# Patient Record
Sex: Female | Born: 1975
Health system: Southern US, Community
[De-identification: ages and names within clinical notes are randomized; demographics above are authoritative.]

## PROBLEM LIST (undated history)

## (undated) DIAGNOSIS — F329 Major depressive disorder, single episode, unspecified: Secondary | ICD-10-CM

## (undated) DIAGNOSIS — E039 Hypothyroidism, unspecified: Secondary | ICD-10-CM

## (undated) DIAGNOSIS — F419 Anxiety disorder, unspecified: Secondary | ICD-10-CM

## (undated) DIAGNOSIS — F32A Depression, unspecified: Secondary | ICD-10-CM

## (undated) HISTORY — DX: Depression, unspecified: F32.A

## (undated) HISTORY — DX: Hypothyroidism, unspecified: E03.9

## (undated) HISTORY — DX: Major depressive disorder, single episode, unspecified: F32.9

## (undated) HISTORY — PX: HEART CHAMBER REVISION: SHX267

## (undated) HISTORY — DX: Anxiety disorder, unspecified: F41.9

---
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
## 1997-04-04 HISTORY — PX: WISDOM TOOTH EXTRACTION: SHX21

## 1998-08-12 ENCOUNTER — Emergency Department (HOSPITAL_COMMUNITY): Admission: EM | Admit: 1998-08-12 | Discharge: 1998-08-12 | Payer: Self-pay | Admitting: Emergency Medicine

## 1998-11-04 ENCOUNTER — Other Ambulatory Visit: Admission: RE | Admit: 1998-11-04 | Discharge: 1998-11-04 | Payer: Self-pay | Admitting: Obstetrics and Gynecology

## 1999-11-05 ENCOUNTER — Other Ambulatory Visit: Admission: RE | Admit: 1999-11-05 | Discharge: 1999-11-05 | Payer: Self-pay | Admitting: Obstetrics and Gynecology

## 2000-11-21 ENCOUNTER — Other Ambulatory Visit: Admission: RE | Admit: 2000-11-21 | Discharge: 2000-11-21 | Payer: Self-pay | Admitting: Obstetrics and Gynecology

## 2001-09-18 ENCOUNTER — Other Ambulatory Visit: Admission: RE | Admit: 2001-09-18 | Discharge: 2001-09-18 | Payer: Self-pay | Admitting: Obstetrics and Gynecology

## 2002-10-01 ENCOUNTER — Other Ambulatory Visit: Admission: RE | Admit: 2002-10-01 | Discharge: 2002-10-01 | Payer: Self-pay | Admitting: Obstetrics and Gynecology

## 2003-09-05 ENCOUNTER — Other Ambulatory Visit: Admission: RE | Admit: 2003-09-05 | Discharge: 2003-09-05 | Payer: Self-pay | Admitting: Obstetrics and Gynecology

## 2004-09-02 ENCOUNTER — Other Ambulatory Visit: Admission: RE | Admit: 2004-09-02 | Discharge: 2004-09-02 | Payer: Self-pay | Admitting: Obstetrics and Gynecology

## 2005-03-07 ENCOUNTER — Inpatient Hospital Stay (HOSPITAL_COMMUNITY): Admission: AD | Admit: 2005-03-07 | Discharge: 2005-03-10 | Payer: Self-pay | Admitting: Obstetrics and Gynecology

## 2005-04-28 ENCOUNTER — Other Ambulatory Visit: Admission: RE | Admit: 2005-04-28 | Discharge: 2005-04-28 | Payer: Self-pay | Admitting: Obstetrics and Gynecology

## 2011-11-23 ENCOUNTER — Other Ambulatory Visit: Payer: Self-pay | Admitting: Obstetrics and Gynecology

## 2013-02-04 ENCOUNTER — Other Ambulatory Visit: Payer: Self-pay | Admitting: Internal Medicine

## 2013-02-04 LAB — LIPID PANEL
Cholesterol: 205 mg/dL — ABNORMAL HIGH (ref 0–200)
VLDL: 28 mg/dL (ref 0–40)

## 2013-02-04 LAB — CBC WITH DIFFERENTIAL/PLATELET
Basophils Absolute: 0 10*3/uL (ref 0.0–0.1)
Basophils Relative: 1 % (ref 0–1)
Eosinophils Absolute: 0.3 10*3/uL (ref 0.0–0.7)
MCH: 29.5 pg (ref 26.0–34.0)
MCHC: 34.2 g/dL (ref 30.0–36.0)
Monocytes Relative: 6 % (ref 3–12)
Neutrophils Relative %: 54 % (ref 43–77)
Platelets: 271 10*3/uL (ref 150–400)
RDW: 13.7 % (ref 11.5–15.5)

## 2013-02-04 LAB — BASIC METABOLIC PANEL WITH GFR
Calcium: 10 mg/dL (ref 8.4–10.5)
GFR, Est African American: >89 mL/min
GFR, Est Non African American: >89 mL/min
Potassium: 4.4 mEq/L (ref 3.5–5.3)
Sodium: 139 mEq/L (ref 135–145)

## 2013-02-04 LAB — HEPATIC FUNCTION PANEL
ALT: 17 U/L (ref 0–35)
Bilirubin, Direct: 0.1 mg/dL (ref 0.0–0.3)
Indirect Bilirubin: 0.3 mg/dL (ref 0.0–0.9)

## 2013-02-04 LAB — IRON AND TIBC
%SAT: 16 % — ABNORMAL LOW (ref 20–55)
Iron: 60 ug/dL (ref 42–145)
UIBC: 309 ug/dL (ref 125–400)

## 2013-02-05 LAB — TSH: TSH: 4.795 u[IU]/mL — ABNORMAL HIGH (ref 0.350–4.500)

## 2013-02-05 LAB — VITAMIN B12: Vitamin B-12: 430 pg/mL (ref 211–911)

## 2013-02-05 LAB — URINALYSIS, ROUTINE W REFLEX MICROSCOPIC
Bilirubin Urine: NEGATIVE
Glucose, UA: NEGATIVE mg/dL
Hgb urine dipstick: NEGATIVE
Ketones, ur: NEGATIVE mg/dL
Specific Gravity, Urine: <1.005 — ABNORMAL LOW (ref 1.005–1.030)
Urobilinogen, UA: 0.2 mg/dL (ref 0.0–1.0)

## 2013-02-05 LAB — MICROALBUMIN / CREATININE URINE RATIO
Creatinine, Urine: 16.9 mg/dL
Microalb, Ur: 0.5 mg/dL (ref 0.00–1.89)

## 2013-02-05 LAB — VITAMIN D 25 HYDROXY (VIT D DEFICIENCY, FRACTURES): Vit D, 25-Hydroxy: 33 ng/mL (ref 30–89)

## 2013-02-05 LAB — HEMOGLOBIN A1C
Hgb A1c MFr Bld: 5.7 % — ABNORMAL HIGH (ref <5.7)
Mean Plasma Glucose: 117 mg/dL — ABNORMAL HIGH (ref <117)

## 2013-02-21 ENCOUNTER — Other Ambulatory Visit: Payer: Self-pay | Admitting: Internal Medicine

## 2013-02-21 MED ORDER — LEVOTHYROXINE SODIUM 75 MCG PO TABS: 75.0000 ug | ORAL_TABLET | Freq: Every day | ORAL | Status: DC

## 2013-03-07 ENCOUNTER — Other Ambulatory Visit: Payer: Self-pay

## 2013-04-12 ENCOUNTER — Other Ambulatory Visit: Payer: Self-pay | Admitting: Obstetrics and Gynecology

## 2013-04-12 DIAGNOSIS — N644 Mastodynia: Secondary | ICD-10-CM

## 2013-04-24 ENCOUNTER — Other Ambulatory Visit: Payer: Self-pay | Admitting: Obstetrics and Gynecology

## 2013-04-24 ENCOUNTER — Ambulatory Visit
Admission: RE | Admit: 2013-04-24 | Discharge: 2013-04-24 | Disposition: A | Discharge status: Home / Self Care | Payer: 59 | Source: Ambulatory Visit | Attending: Obstetrics and Gynecology | Admitting: Obstetrics and Gynecology

## 2013-04-24 DIAGNOSIS — N644 Mastodynia: Secondary | ICD-10-CM

## 2013-05-08 ENCOUNTER — Other Ambulatory Visit: Payer: Self-pay | Admitting: Physician Assistant

## 2013-06-17 ENCOUNTER — Ambulatory Visit (INDEPENDENT_AMBULATORY_CARE_PROVIDER_SITE_OTHER): Payer: 59 | Admitting: Physician Assistant

## 2013-06-17 VITALS — BP 110/68 | HR 68 | Temp 98.1°F | Resp 16 | Wt 156.0 lb

## 2013-06-17 DIAGNOSIS — M5430 Sciatica, unspecified side: Secondary | ICD-10-CM

## 2013-06-17 DIAGNOSIS — M5432 Sciatica, left side: Secondary | ICD-10-CM

## 2013-06-17 DIAGNOSIS — R3129 Other microscopic hematuria: Secondary | ICD-10-CM

## 2013-06-17 DIAGNOSIS — E039 Hypothyroidism, unspecified: Secondary | ICD-10-CM

## 2013-06-17 MED ORDER — MELOXICAM 15 MG PO TABS: 15.0000 mg | ORAL_TABLET | Freq: Every day | ORAL | Status: DC

## 2013-06-17 MED ORDER — CYCLOBENZAPRINE HCL 10 MG PO TABS: 10.0000 mg | ORAL_TABLET | Freq: Three times a day (TID) | ORAL | Status: DC | PRN

## 2013-06-17 MED ORDER — DEXAMETHASONE SODIUM PHOSPHATE 100 MG/10ML IJ SOLN
10.0000 mg | Freq: Once | INTRAMUSCULAR | Status: AC
  Administered 2013-06-17: 10 mg via INTRAMUSCULAR

## 2013-06-17 MED ORDER — LIDOCAINE HCL 1 % IJ SOLN
10.0000 mL | Freq: Once | INTRAMUSCULAR | Status: AC
  Administered 2013-06-17: 10 mL

## 2013-06-17 NOTE — Progress Notes (Signed)
   Subjective:    Patient ID: Alyssa Skinner, female    DOB: 04/02/1976, [age] y.o.   MRN: [id number]  HPI 38 y.o. female states that she was turning to get medication when she felt a pain in left butt down lateral leg to knee. She went to urgent care and they gave her toradol, got a urine that showed microscopic blood. Has improved, currently still on Ketorolac 10mg  daily, worse with turning, bending but no longer radiating to her leg. Better with rest/ being still.   Last Menses was on 06/08/2013. Denies trouble with urination, bowel problems, weakness, nausea, vomiting.    Review of Systems  Constitutional: Negative.   HENT: Negative.   Respiratory: Negative.   Cardiovascular: Negative.   Gastrointestinal: Negative.   Genitourinary: Negative.   Musculoskeletal: Positive for back pain and gait problem (antalgic). Negative for myalgias.  Neurological: Negative.        Objective:   Physical Exam  Constitutional: She appears well-developed and well-nourished.  HENT:  Head: Normocephalic and atraumatic.  Cardiovascular: Normal rate and regular rhythm.   Pulmonary/Chest: Effort normal and breath sounds normal.  Abdominal: Soft. Bowel sounds are normal.  Musculoskeletal: She exhibits tenderness.  + left SI pain, neg straight leg, reflexes equal bilaterally, strength 5/5, some left SI pain with hip flexion/rotation but full ROM. Negative CVA  Skin: Skin is warm and dry.       Assessment & Plan:  Left SI pain- likely muscular, trigger point given, tolerated well RICE, exercises, flexeril, mobic A trigger point injection was performed at the site of maximal tenderness using 1% plain Lidocaine and Dexamethasone 10mg . This was well tolerated, and followed by immediate relief of pain.   Hypothyroid- check TSH  Hematuria- recheck urine, kidney stone unlikely.

## 2013-06-17 NOTE — Patient Instructions (Signed)

## 2013-06-18 LAB — URINALYSIS, ROUTINE W REFLEX MICROSCOPIC
Bilirubin Urine: NEGATIVE
Glucose, UA: NEGATIVE mg/dL
HGB URINE DIPSTICK: NEGATIVE
Ketones, ur: NEGATIVE mg/dL
LEUKOCYTES UA: NEGATIVE
NITRITE: NEGATIVE
Protein, ur: NEGATIVE mg/dL
Specific Gravity, Urine: 1.02 (ref 1.005–1.030)
Urobilinogen, UA: 0.2 mg/dL (ref 0.0–1.0)
pH: 6.5 (ref 5.0–8.0)

## 2013-06-18 LAB — TSH: TSH: 3.355 u[IU]/mL (ref 0.350–4.500)

## 2013-06-18 LAB — URINE CULTURE
Colony Count: NO GROWTH
Organism ID, Bacteria: NO GROWTH

## 2013-06-25 ENCOUNTER — Other Ambulatory Visit: Payer: Self-pay | Admitting: Obstetrics and Gynecology

## 2013-07-08 ENCOUNTER — Other Ambulatory Visit: Payer: Self-pay | Admitting: Emergency Medicine

## 2013-08-26 ENCOUNTER — Other Ambulatory Visit: Payer: Self-pay | Admitting: Physician Assistant

## 2013-09-04 ENCOUNTER — Other Ambulatory Visit: Payer: Self-pay | Admitting: Physician Assistant

## 2013-09-04 MED ORDER — ALPRAZOLAM 0.5 MG PO TABS: ORAL_TABLET | ORAL | Status: DC

## 2013-09-04 MED ORDER — LEVOTHYROXINE SODIUM 112 MCG PO TABS: 112.0000 ug | ORAL_TABLET | Freq: Every day | ORAL | Status: DC

## 2013-10-21 LAB — OB RESULTS CONSOLE HIV ANTIBODY (ROUTINE TESTING): HIV: NONREACTIVE

## 2013-10-21 LAB — OB RESULTS CONSOLE HEPATITIS B SURFACE ANTIGEN: Hepatitis B Surface Ag: NEGATIVE

## 2013-10-21 LAB — OB RESULTS CONSOLE RPR: RPR: NONREACTIVE

## 2013-10-21 LAB — OB RESULTS CONSOLE RUBELLA ANTIBODY, IGM: RUBELLA: NON-IMMUNE/NOT IMMUNE

## 2013-10-28 ENCOUNTER — Other Ambulatory Visit: Payer: Self-pay | Admitting: Physician Assistant

## 2013-11-05 LAB — OB RESULTS CONSOLE GC/CHLAMYDIA
Chlamydia: NEGATIVE
GC PROBE AMP, GENITAL: NEGATIVE

## 2014-01-09 ENCOUNTER — Other Ambulatory Visit: Payer: Self-pay | Admitting: Physician Assistant

## 2014-01-09 MED ORDER — ALPRAZOLAM 0.5 MG PO TABS: ORAL_TABLET | ORAL | Status: DC

## 2014-02-02 DIAGNOSIS — E039 Hypothyroidism, unspecified: Secondary | ICD-10-CM | POA: Insufficient documentation

## 2014-02-02 DIAGNOSIS — F329 Major depressive disorder, single episode, unspecified: Secondary | ICD-10-CM | POA: Insufficient documentation

## 2014-02-02 DIAGNOSIS — F419 Anxiety disorder, unspecified: Secondary | ICD-10-CM | POA: Insufficient documentation

## 2014-02-02 DIAGNOSIS — F3341 Major depressive disorder, recurrent, in partial remission: Secondary | ICD-10-CM | POA: Insufficient documentation

## 2014-02-04 ENCOUNTER — Encounter: Payer: Self-pay | Admitting: Physician Assistant

## 2014-05-01 LAB — OB RESULTS CONSOLE GBS: GBS: NEGATIVE

## 2014-05-12 ENCOUNTER — Encounter (HOSPITAL_COMMUNITY): Payer: Self-pay | Admitting: *Deleted

## 2014-05-12 ENCOUNTER — Inpatient Hospital Stay (HOSPITAL_COMMUNITY): Payer: 59 | Admitting: Anesthesiology

## 2014-05-12 ENCOUNTER — Inpatient Hospital Stay (HOSPITAL_COMMUNITY)
Admission: AD | Admit: 2014-05-12 | Discharge: 2014-05-14 | DRG: 775 | Disposition: A | Discharge status: Home / Self Care | Payer: 59 | Source: Ambulatory Visit | Attending: Obstetrics and Gynecology | Admitting: Obstetrics and Gynecology

## 2014-05-12 DIAGNOSIS — O09523 Supervision of elderly multigravida, third trimester: Secondary | ICD-10-CM

## 2014-05-12 DIAGNOSIS — F419 Anxiety disorder, unspecified: Secondary | ICD-10-CM | POA: Diagnosis present

## 2014-05-12 DIAGNOSIS — Z3A38 38 weeks gestation of pregnancy: Secondary | ICD-10-CM | POA: Diagnosis present

## 2014-05-12 DIAGNOSIS — O99284 Endocrine, nutritional and metabolic diseases complicating childbirth: Principal | ICD-10-CM | POA: Diagnosis present

## 2014-05-12 DIAGNOSIS — O99344 Other mental disorders complicating childbirth: Secondary | ICD-10-CM | POA: Diagnosis present

## 2014-05-12 DIAGNOSIS — E039 Hypothyroidism, unspecified: Secondary | ICD-10-CM | POA: Diagnosis present

## 2014-05-12 DIAGNOSIS — F329 Major depressive disorder, single episode, unspecified: Secondary | ICD-10-CM | POA: Diagnosis present

## 2014-05-12 LAB — TYPE AND SCREEN
ABO/RH(D): O POS
ANTIBODY SCREEN: NEGATIVE

## 2014-05-12 LAB — CBC
HCT: 32.5 % — ABNORMAL LOW (ref 36.0–46.0)
HEMOGLOBIN: 11.4 g/dL — AB (ref 12.0–15.0)
MCH: 30.2 pg (ref 26.0–34.0)
MCHC: 35.1 g/dL (ref 30.0–36.0)
MCV: 86 fL (ref 78.0–100.0)
PLATELETS: 252 10*3/uL (ref 150–400)
RBC: 3.78 MIL/uL — ABNORMAL LOW (ref 3.87–5.11)
RDW: 13.3 % (ref 11.5–15.5)
WBC: 12 10*3/uL — ABNORMAL HIGH (ref 4.0–10.5)

## 2014-05-12 LAB — ABO/RH: ABO/RH(D): O POS

## 2014-05-12 MED ORDER — DIPHENHYDRAMINE HCL 25 MG PO CAPS: 25.0000 mg | ORAL_CAPSULE | Freq: Four times a day (QID) | ORAL | Status: DC | PRN

## 2014-05-12 MED ORDER — LIDOCAINE HCL (PF) 1 % IJ SOLN
30.0000 mL | INTRAMUSCULAR | Status: DC | PRN
  Filled 2014-05-12: qty 30

## 2014-05-12 MED ORDER — PHENYLEPHRINE 40 MCG/ML (10ML) SYRINGE FOR IV PUSH (FOR BLOOD PRESSURE SUPPORT)
80.0000 ug | PREFILLED_SYRINGE | INTRAVENOUS | Status: DC | PRN
  Filled 2014-05-12: qty 2
  Filled 2014-05-12: qty 20

## 2014-05-12 MED ORDER — LIDOCAINE-EPINEPHRINE (PF) 2 %-1:200000 IJ SOLN
INTRAMUSCULAR | Status: DC | PRN
  Administered 2014-05-12: 3 mL

## 2014-05-12 MED ORDER — BENZOCAINE-MENTHOL 20-0.5 % EX AERO
1.0000 "application " | INHALATION_SPRAY | CUTANEOUS | Status: DC | PRN
  Administered 2014-05-12: 1 via TOPICAL
  Filled 2014-05-12: qty 56

## 2014-05-12 MED ORDER — BUPIVACAINE HCL (PF) 0.25 % IJ SOLN
INTRAMUSCULAR | Status: DC | PRN
Start: 2014-05-12 — End: 2014-05-12
  Administered 2014-05-12 (×2): 4 mL via EPIDURAL

## 2014-05-12 MED ORDER — ONDANSETRON HCL 4 MG/2ML IJ SOLN: 4.0000 mg | Freq: Four times a day (QID) | INTRAMUSCULAR | Status: DC | PRN

## 2014-05-12 MED ORDER — ACETAMINOPHEN 325 MG PO TABS: 650.0000 mg | ORAL_TABLET | ORAL | Status: DC | PRN

## 2014-05-12 MED ORDER — CITRIC ACID-SODIUM CITRATE 334-500 MG/5ML PO SOLN: 30.0000 mL | ORAL | Status: DC | PRN

## 2014-05-12 MED ORDER — FENTANYL 2.5 MCG/ML BUPIVACAINE 1/10 % EPIDURAL INFUSION (WH - ANES)
14.0000 mL/h | INTRAMUSCULAR | Status: DC | PRN
  Administered 2014-05-12 (×2): 14 mL/h via EPIDURAL
  Filled 2014-05-12 (×2): qty 125

## 2014-05-12 MED ORDER — WITCH HAZEL-GLYCERIN EX PADS: 1.0000 "application " | MEDICATED_PAD | CUTANEOUS | Status: DC | PRN

## 2014-05-12 MED ORDER — MEASLES, MUMPS & RUBELLA VAC ~~LOC~~ INJ
0.5000 mL | INJECTION | Freq: Once | SUBCUTANEOUS | Status: AC
  Administered 2014-05-14: 0.5 mL via SUBCUTANEOUS
  Filled 2014-05-12: qty 0.5

## 2014-05-12 MED ORDER — OXYTOCIN BOLUS FROM INFUSION
500.0000 mL | INTRAVENOUS | Status: DC
  Administered 2014-05-12: 500 mL via INTRAVENOUS

## 2014-05-12 MED ORDER — OXYCODONE-ACETAMINOPHEN 5-325 MG PO TABS: 2.0000 | ORAL_TABLET | ORAL | Status: DC | PRN

## 2014-05-12 MED ORDER — OXYCODONE-ACETAMINOPHEN 5-325 MG PO TABS: 1.0000 | ORAL_TABLET | ORAL | Status: DC | PRN

## 2014-05-12 MED ORDER — LACTATED RINGERS IV SOLN
INTRAVENOUS | Status: DC
  Administered 2014-05-12: 06:00:00 via INTRAVENOUS

## 2014-05-12 MED ORDER — ONDANSETRON HCL 4 MG/2ML IJ SOLN: 4.0000 mg | INTRAMUSCULAR | Status: DC | PRN

## 2014-05-12 MED ORDER — ONDANSETRON HCL 4 MG PO TABS: 4.0000 mg | ORAL_TABLET | ORAL | Status: DC | PRN

## 2014-05-12 MED ORDER — TETANUS-DIPHTH-ACELL PERTUSSIS 5-2.5-18.5 LF-MCG/0.5 IM SUSP
0.5000 mL | Freq: Once | INTRAMUSCULAR | Status: DC
Start: 2014-05-13 — End: 2014-05-13

## 2014-05-12 MED ORDER — IBUPROFEN 600 MG PO TABS
600.0000 mg | ORAL_TABLET | Freq: Four times a day (QID) | ORAL | Status: DC
  Administered 2014-05-12 – 2014-05-14 (×7): 600 mg via ORAL
  Filled 2014-05-12 (×7): qty 1

## 2014-05-12 MED ORDER — DIPHENHYDRAMINE HCL 50 MG/ML IJ SOLN: 12.5000 mg | INTRAMUSCULAR | Status: DC | PRN

## 2014-05-12 MED ORDER — LANOLIN HYDROUS EX OINT: TOPICAL_OINTMENT | CUTANEOUS | Status: DC | PRN

## 2014-05-12 MED ORDER — LACTATED RINGERS IV SOLN: 500.0000 mL | Freq: Once | INTRAVENOUS | Status: DC

## 2014-05-12 MED ORDER — MEDROXYPROGESTERONE ACETATE 150 MG/ML IM SUSP: 150.0000 mg | INTRAMUSCULAR | Status: DC | PRN

## 2014-05-12 MED ORDER — LEVOTHYROXINE SODIUM 112 MCG PO TABS
112.0000 ug | ORAL_TABLET | Freq: Every day | ORAL | Status: DC
  Filled 2014-05-12 (×3): qty 1

## 2014-05-12 MED ORDER — EPHEDRINE 5 MG/ML INJ
10.0000 mg | INTRAVENOUS | Status: DC | PRN
  Filled 2014-05-12: qty 2

## 2014-05-12 MED ORDER — LACTATED RINGERS IV SOLN: 500.0000 mL | INTRAVENOUS | Status: DC | PRN

## 2014-05-12 MED ORDER — SIMETHICONE 80 MG PO CHEW
80.0000 mg | CHEWABLE_TABLET | ORAL | Status: DC | PRN
Start: 2014-05-12 — End: 2014-05-14

## 2014-05-12 MED ORDER — PRENATAL MULTIVITAMIN CH
1.0000 | ORAL_TABLET | Freq: Every day | ORAL | Status: DC
  Administered 2014-05-13 – 2014-05-14 (×2): 1 via ORAL
  Filled 2014-05-12: qty 1

## 2014-05-12 MED ORDER — DIBUCAINE 1 % RE OINT: 1.0000 "application " | TOPICAL_OINTMENT | RECTAL | Status: DC | PRN

## 2014-05-12 MED ORDER — FENTANYL 2.5 MCG/ML BUPIVACAINE 1/10 % EPIDURAL INFUSION (WH - ANES)
INTRAMUSCULAR | Status: DC | PRN
  Administered 2014-05-12: 14 mL/h via EPIDURAL

## 2014-05-12 MED ORDER — OXYTOCIN 40 UNITS IN LACTATED RINGERS INFUSION - SIMPLE MED
62.5000 mL/h | INTRAVENOUS | Status: DC
  Filled 2014-05-12: qty 1000

## 2014-05-12 MED ORDER — FLEET ENEMA 7-19 GM/118ML RE ENEM: 1.0000 | ENEMA | RECTAL | Status: DC | PRN

## 2014-05-12 MED ORDER — SENNOSIDES-DOCUSATE SODIUM 8.6-50 MG PO TABS
2.0000 | ORAL_TABLET | ORAL | Status: DC
  Administered 2014-05-13 – 2014-05-14 (×2): 2 via ORAL
  Filled 2014-05-12 (×2): qty 2

## 2014-05-12 MED ORDER — PHENYLEPHRINE 40 MCG/ML (10ML) SYRINGE FOR IV PUSH (FOR BLOOD PRESSURE SUPPORT)
80.0000 ug | PREFILLED_SYRINGE | INTRAVENOUS | Status: DC | PRN
  Filled 2014-05-12: qty 2

## 2014-05-12 NOTE — Anesthesia Procedure Notes (Signed)
Epidural Patient location during procedure: OB  Staffing Anesthesiologist: Otto Felkins, CHRIS Performed by: anesthesiologist   Preanesthetic Checklist Completed: patient identified, surgical consent, pre-op evaluation, timeout performed, IV checked, risks and benefits discussed and monitors and equipment checked  Epidural Patient position: sitting Prep: site prepped and draped and DuraPrep Patient monitoring: heart rate, cardiac monitor, continuous pulse ox and blood pressure Approach: midline Location: L3-L4 Injection technique: LOR saline  Needle:  Needle type: Tuohy  Needle gauge: 17 G Needle length: 9 cm Needle insertion depth: 7 cm Catheter type: closed end flexible Catheter size: 19 Gauge Catheter at skin depth: 13 cm Test dose: negative and 2% lidocaine with Epi 1:200 K  Assessment Events: blood not aspirated, injection not painful, no injection resistance, negative IV test and no paresthesia  Additional Notes H+P and labs checked, risks and benefits discussed with the patient, consent obtained, procedure tolerated well and without complications.  Reason for block:procedure for pain   

## 2014-05-12 NOTE — Anesthesia Preprocedure Evaluation (Signed)
Anesthesia Evaluation  Patient identified by MRN, date of birth, ID band Patient awake    Reviewed: Allergy & Precautions, NPO status , Patient's Chart, lab work & pertinent test results  History of Anesthesia Complications Negative for: history of anesthetic complications  Airway Mallampati: II  TM Distance: >3 FB Neck ROM: Full    Dental  (+) Teeth Intact   Pulmonary neg shortness of breath, neg sleep apnea, neg COPDneg recent URI, former smoker,  breath sounds clear to auscultation        Cardiovascular negative cardio ROS  Rhythm:Regular  Closure of PDA surgically   Neuro/Psych PSYCHIATRIC DISORDERS Anxiety Depression negative neurological ROS     GI/Hepatic negative GI ROS,   Endo/Other  Hypothyroidism   Renal/GU      Musculoskeletal   Abdominal   Peds  Hematology  (+) anemia ,   Anesthesia Other Findings   Reproductive/Obstetrics                             Anesthesia Physical Anesthesia Plan  ASA: II  Anesthesia Plan: Epidural   Post-op Pain Management:    Induction:   Airway Management Planned:   Additional Equipment:   Intra-op Plan:   Post-operative Plan:   Informed Consent: I have reviewed the patients History and Physical, chart, labs and discussed the procedure including the risks, benefits and alternatives for the proposed anesthesia with the patient or authorized representative who has indicated his/her understanding and acceptance.     Plan Discussed with: Anesthesiologist  Anesthesia Plan Comments:         Anesthesia Quick Evaluation

## 2014-05-12 NOTE — Lactation Note (Signed)
This note was copied from the chart of Girl Olevia Bowensngela Brideau. Lactation Consultation Note  Patient Name: Girl Olevia Bowensngela Peoples ZOXWR'UToday's Date: 05/12/2014 Reason for consult: Follow-up assessment;Infant < 6lbs;Difficult latch Baby 8 hours of life. Mom states her nurse showed her how to hand express and she was able to obtain colostrum and give drops to baby. Mom reports that baby about to get her bath. Enc mom to call out for assistance with latching when baby cues to nurse. Discussed beginning use of DEBP if baby continues not to latch well through the night since mom did not have a good supply with first baby and this baby just over 5 pounds.   Maternal Data    Feeding Feeding Type: Breast Fed Length of feed: 0 min  LATCH Score/Interventions                      Lactation Tools Discussed/Used     Consult Status Consult Status: PRN    Geralynn OchsWILLIARD, Fredis Malkiewicz 05/12/2014, 8:38 PM

## 2014-05-12 NOTE — Lactation Note (Signed)
This note was copied from the chart of Alyssa Olevia Bowensngela Rohwer. Lactation Consultation Note  Patient Name: Alyssa Skinner RUEAV'WToday's Date: 05/12/2014 Reason for consult: Initial assessment;Infant < 6lbs  Initial visit at 4 hrs after birth.  Baby 38 wks, and 5 lbs 3.6 oz birth weight.  Baby latched on after birth for 10 minutes, with a 6 latch score (flat nipples and occasional swallowing) .  Serum blood sugar done recently 57.  Mom has attempted another time to latch, but baby not interested.  Baby skin to skin at present.  Offered assistance with positioning and latching, but Mom exhausted.  Offered to show her how to manually express colostrum, but she declined, so LC talked her through it.  Explained how she can manually express prior to putting baby on the breast.  I also discussed that she could manually express colostrum into a cup and dropper feed to baby. Talked about setting up a pump to increase stimulation for her milk supply.  Mom started to tear up, so I told her she should enjoy her baby skin to skin on her chest.  Mom is exhausted and has a history of anxiety and depression.  I asked her to [facility] or her RN when baby starts to cue to feed. Mom did express that she had no milk with first baby, who is 9 now.  She stated he never latched, and doesn't describe any engorgement.  She was undiagnosed hypothyroid, but is now being treated.  Mom describes tenderness and some breast enlargement at pregnancy onset.  Reassured her that every pregnancy, and every baby is different.  I explained that we are there for her to make this a positive experience for her.  Brochure left with Mom.  Informed Mom of IP and OP lactation services available to her.  To call for help, and follow up in am.   Consult Status Consult Status: Follow-up Date: 05/12/14 Follow-up type: In-patient    Judee ClaraSmith, Dion Sibal E 05/12/2014, 4:20 PM

## 2014-05-12 NOTE — MAU Note (Signed)
Report given to VanossAleta, Charity fundraiserN. Pt may transfer to room 164.

## 2014-05-12 NOTE — H&P (Signed)
Alyssa Skinner is a 39 y.o. female presenting for labor.  Pt denies pregnancy complications.  No vb or lof.    History OB History    Gravida Para Term Preterm AB TAB SAB Ectopic Multiple Living   2 1        1      Past Medical History  Diagnosis Date  . Hypothyroid   . Depression   . Anxiety    Past Surgical History  Procedure Laterality Date  . Heart chamber revision  1979   Family History: family history includes Hyperlipidemia in her father and mother. Social History:  reports that she has quit smoking. She has never used smokeless tobacco. She reports that she drinks alcohol. She reports that she does not use illicit drugs.   Prenatal Transfer Tool  Maternal Diabetes: No Genetic Screening Maternal Ultrasounds/Referrals: Normal Fetal Ultrasounds or other Referrals:  None Maternal Substance Abuse:  No Significant Maternal Medications:  None Significant Maternal Lab Results:  None Other Comments:  None  ROS  Dilation: 5.5 Effacement (%): 100 Station: -1 Exam by:: ansah-mensah, rnc Blood pressure 113/71, pulse 78, temperature 98.5 F (36.9 C), temperature source Oral, resp. rate 20, height 5\' 8"  (1.727 m), weight 76.204 kg (168 lb), SpO2 100 %. Exam Physical Exam  Gen - comfortable w/ epidural Abd - gravid, NT Ext - NT Cvx 6/90/-1 AROM - clear Prenatal labs: ABO, Rh: --/--/O POS, O POS (02/08 0240) Antibody: NEG (02/08 0240) Rubella: Nonimmune (07/20 0000) RPR: Nonreactive (07/20 0000)  HBsAg: Negative (07/20 0000)  HIV: Non-reactive (07/20 0000)  GBS: Negative (01/28 0000)   Assessment/Plan: Labor Exp mngt   Lanis Storlie 05/12/2014, 8:04 AM

## 2014-05-12 NOTE — Progress Notes (Signed)
SVD of vigorous female infant w/ apgars of 9,9.  Placenta delivered spontaneous w/ 3VC.   2nd degree midline episiotomy repaired w/ 3-0 vicryl rapide.  Fundus firm.  EBL 350cc .

## 2014-05-13 LAB — CBC
HCT: 30.2 % — ABNORMAL LOW (ref 36.0–46.0)
HEMOGLOBIN: 10.2 g/dL — AB (ref 12.0–15.0)
MCH: 29.9 pg (ref 26.0–34.0)
MCHC: 33.8 g/dL (ref 30.0–36.0)
MCV: 88.6 fL (ref 78.0–100.0)
Platelets: 199 10*3/uL (ref 150–400)
RBC: 3.41 MIL/uL — ABNORMAL LOW (ref 3.87–5.11)
RDW: 13.4 % (ref 11.5–15.5)
WBC: 12.3 10*3/uL — ABNORMAL HIGH (ref 4.0–10.5)

## 2014-05-13 LAB — RPR: RPR Ser Ql: NONREACTIVE

## 2014-05-13 NOTE — Plan of Care (Signed)
Problem: Discharge Progression Outcomes Goal: MMR given as ordered Outcome: Not Progressing Needs MMR     

## 2014-05-13 NOTE — Lactation Note (Signed)
This note was copied from the chart of Alyssa Skinner. Lactation Consultation Note Baby hadn't BF during the night d/t didn't wake up and mom slept also. RN discussed baby small must wake to BF every 3 hrs. At least. When entered rm. Mom BF in cradle position, baby just sucking on tip of nipple edges. Explained milk transfer and need for baby to get nipple back into the mouth. Mom has shells but wasn't wearing them. Encouraged to put bra on after BF and wear to assist in everting nipples. Fitted #16 NS, demonstrated application and care. Baby latched well in football position, gave 7ml Alementum via curve tip syring in NS. Demonstrated breast massage and latching w/"C" hold. Mom has large tubular breast and large areaols, small flat nipples. Nipple borders not defined well. Has darker pigmentation around nipple, looks almost like bruising, but mom states that's her normal nipple. DEBP set up and instructions on cleaning. Mom is to post-pump 15-20 min to stimulate breast. Hand expression w/glistening of colostrum to end of nipple. Noted good colostrum in nipple shield after BF.  plan is to put baby to breast at least every 3 hrs. If haven't cued before then. Use NS #16, post-pump, if obtains colostrum may give that and subtract from formula for amount on feeding sheet according to hours of age. Patient Name: Alyssa Skinner ZOXWR'UToday's Date: 05/13/2014 Reason for consult: Follow-up assessment;Difficult latch   Maternal Data    Feeding Feeding Type: Formula Length of feed: 15 min  LATCH Score/Interventions Latch: Grasps breast easily, tongue down, lips flanged, rhythmical sucking. Intervention(s): Adjust position;Assist with latch;Breast massage;Breast compression  Audible Swallowing: A few with stimulation Intervention(s): Skin to skin;Hand expression;Alternate breast massage  Type of Nipple: Flat Intervention(s): Double electric pump;Shells;Hand pump  Comfort (Breast/Nipple): Soft /  non-tender     Hold (Positioning): Assistance needed to correctly position infant at breast and maintain latch. Intervention(s): Breastfeeding basics reviewed;Support Pillows;Position options;Skin to skin  LATCH Score: 7  Lactation Tools Discussed/Used Tools: Shells;Nipple Dorris CarnesShields;Pump Nipple shield size: 16 Shell Type: Inverted Breast pump type: Double-Electric Breast Pump Pump Review: Setup, frequency, and cleaning;Milk Storage Initiated by:: Peri JeffersonL. Jeren Dufrane RN Date initiated:: 05/13/14   Consult Status Consult Status: Follow-up Date: 05/13/14 Follow-up type: In-patient    Leafy Motsinger, Diamond NickelLAURA G 05/13/2014, 9:08 AM

## 2014-05-13 NOTE — Progress Notes (Signed)
Clinical Social Work Department PSYCHOSOCIAL ASSESSMENT - MATERNAL/CHILD 05/13/2014  Patient:  Alyssa Skinner, Alyssa Skinner  Account Number:  [id number]  Admit Date:  05/12/2014  Ardine Eng Name:   Monia Pouch   Clinical Social Worker:  Lucita Ferrara, CLINICAL SOCIAL WORKER   Date/Time:  05/13/2014 09:15 AM  Date Referred:  05/12/2014   Referral source  Central Nursery     Referred reason  Depression/Anxiety   Other referral source:    I:  FAMILY / HOME ENVIRONMENT Child's legal guardian:  PARENT  Guardian - Name Guardian - Age Guardian - Address  K[place] [address] [address], [place] [address]  [address]  same as above   Other household support members/support persons Name Relationship DOB  Patrecia Pace 37 years old   Other support:   MOB endorsed strong family support.    II  PSYCHOSOCIAL DATA Information Source:  Patient Interview  Insurance risk surveyor Resources Employment:   MOB endorsed positive employer support.   Financial resources:  Multimedia programmer If [place]:    School / Grade:  N/A Music therapist / Child Services Coordination / Early Interventions:   None reported  Cultural issues impacting care:   None reported    III  STRENGTHS Strengths  Adequate Resources  Home prepared for Child (including basic supplies)  Supportive family/friends   Strength comment:    IV  RISK FACTORS AND CURRENT PROBLEMS Current Problem:  YES   Risk Factor & Current Problem Patient Issue Family Issue Risk Factor / Current Problem Comment  Mental Illness Y N MOB presents with history of depression and anxiety for past 10 years.  She is currently prescribed Zoloft, and denied acute symptoms.   V  SOCIAL WORK ASSESSMENT CSW met with the MOB due to maternal history of depression and anxiety.  She presented as easily engaged and receptive to the visit.  MOB was noted to be displaying a full range in affect, presented in a pleasant mood, and was noted to  be interacting/bonding with the baby.  MOB acknowledged reason for consult, and denied questions or concerns about her mental health as she transitions into the postpartum period. She reported receiving diagnosis of anxiety and depression more than 10 years ago, and shared belief that it was primarily situational due to work related stressors.  She reported normative feelings after her 37 year old son was born, but her reported symptoms did not indicate presence of postpartum depression.  MOB denied acute symptoms during the pregnancy, but shared that she did experience anxiety since she was worried about the infant's health since her son was born with cataracts.  She did endorse feeling "overwhelmed" on 2/8, but she shared insight that it was due to having numerous visitors in the room, experiencing lack of sleep, and then feeling uncomfortable when asked about breastfeeding in the presence of her visitors.   MOB presents with insight and self-awareness as she discussed that she can identify early symptoms that her anxiety is worsening.  She also shared that she has learned how to self-regulate her anxiety by engaging in deep breathing.  MOB stated that she has been prescribed Zoloft, has been compliant during the pregnancy, and intends to maintain current dosage as she transitions into the postpartum period.    CSW reviewed and explored risk and protective factors for postpartum depression.  MOB endorsed strong family support and supportive employer (change in employer since diagnosed with anxiety 10 years ago).  MOB did not identify any other  acute stressors that may negatively impact her transition into the postpartum period.  She reflected upon previous experiences in the postpartum period that increase her confidence in her skills as a mother, including her thought process if she is unable to breastfeed.  She stated that after her 37 year old was born, she became saddened when breast feeding did not go  "well", but discussed that with this infant, she intends to take it one day at a time. She shared an awareness that it will "be okay" if she needs to supplement since she has learned that there is more to being a good mother than breastfeeding.   VI SOCIAL WORK PLAN Social Work Therapist, art  No Further Intervention Required / No Barriers to Discharge   Type of pt/family education:   Postpartum depression   If child protective services report - county:  N/A If child protective services report - date:  N/A Information/referral to community resources comment:   No referrals needed as MOB is currently treating mental health symptoms.   Other social work plan:   CSW to follow up as needed or upon MOB request.

## 2014-05-13 NOTE — Anesthesia Postprocedure Evaluation (Signed)
  Anesthesia Post-op Note  Patient: Alyssa Skinner  Procedure(s) Performed: * No procedures listed *  Patient Location: Mother/Baby  Anesthesia Type:Epidural  Level of Consciousness: awake, alert , oriented and patient cooperative  Airway and Oxygen Therapy: Patient Spontanous Breathing  Post-op Pain: none  Post-op Assessment: Post-op Vital signs reviewed, Patient's Cardiovascular Status Stable, Respiratory Function Stable, Patent Airway, No headache, No backache, No residual numbness and No residual motor weakness  Post-op Vital Signs: Reviewed and stable  Last Vitals:  Filed Vitals:   05/13/14 0650  BP: 120/66  Pulse: 67  Temp: 36.6 C  Resp: 18    Complications: No apparent anesthesia complications

## 2014-05-13 NOTE — Progress Notes (Signed)
Post Partum Day 1 Subjective: no complaints, up ad lib, voiding, tolerating PO and + flatus  Objective: Blood pressure 120/66, pulse 67, temperature 97.8 F (36.6 C), temperature source Oral, resp. rate 18, height 5\' 8"  (1.727 m), weight 168 lb (76.204 kg), SpO2 100 %, unknown if currently breastfeeding.  Physical Exam:  General: alert and cooperative Lochia: appropriate Uterine Fundus: firm Incision: healing well DVT Evaluation: No evidence of DVT seen on physical exam. Negative Homan's sign. No cords or calf tenderness. No significant calf/ankle edema.   Recent Labs  05/12/14 0240 05/13/14 0547  HGB 11.4* 10.2*  HCT 32.5* 30.2*    Assessment/Plan: Plan for discharge tomorrow   LOS: 1 day   CURTIS,CAROL G 05/13/2014, 8:02 AM

## 2014-05-14 MED ORDER — IBUPROFEN 600 MG PO TABS: 600.0000 mg | ORAL_TABLET | Freq: Four times a day (QID) | ORAL | Status: DC

## 2014-05-14 NOTE — Lactation Note (Signed)
This note was copied from the chart of [name]. Lactation Consultation Note: Infant is SGA, had lost 6 %. Mother is using a #16 nipple shield. She is giving alementium through the nipple shield. Infant was last given 10 ml. Advised mother to use supplemental guidelines and increase amts given. Mother advised to talk with Peds about type of formula to use on discharge. Discussed use of 22 cal formula since infant is under 6 lbs. Mother has not pumped with an electric pump . She has one at bedside. She is renting a [employer] for 2 weeks. She states that her insurance company will provide a new pump. Mother was scheduled for an LC follow up on February 16 at 9 am. Lots of teaching about small babies and importance of pumping while supplementing and until milk comes to volume. Mother is to page to assist with latch next feeding.   Patient Name: [name] ZOXWR'UToday's Date: 05/14/2014     Maternal Data    Feeding Feeding Type: Breast Fed Length of feed: 25 min  LATCH Score/Interventions Latch: Grasps breast easily, tongue down, lips flanged, rhythmical sucking. Intervention(s): Assist with latch  Audible Swallowing: Spontaneous and intermittent (put formula in nipple shield)  Type of Nipple: Flat Intervention(s): Shells;Hand pump  Comfort (Breast/Nipple): Soft / non-tender     Hold (Positioning): No assistance needed to correctly position infant at breast.  LATCH Score: 9  Lactation Tools Discussed/Used Tools: Nipple Dorris CarnesShields   Consult Status      Stevan BornKendrick, Sonyia Muro McCoy 05/14/2014, 10:40 AM

## 2014-05-14 NOTE — Discharge Summary (Signed)
Obstetric Discharge Summary Reason for Admission: onset of labor Prenatal Procedures: ultrasound Intrapartum Procedures: spontaneous vaginal delivery Postpartum Procedures: none Complications-Operative and Postpartum: 2 degree perineal laceration HEMOGLOBIN  Date Value Ref Range Status  05/13/2014 10.2* 12.0 - 15.0 Skinner/dL Final   HCT  Date Value Ref Range Status  05/13/2014 30.2* 36.0 - 46.0 % Final    Physical Exam:  General: alert and cooperative Lochia: appropriate Uterine Fundus: firm Incision: healing well DVT Evaluation: No evidence of DVT seen on physical exam. Negative Homan's sign. No cords or calf tenderness. No significant calf/ankle edema.  Discharge Diagnoses: Term Pregnancy-delivered  Discharge Information: Date: 05/14/2014 Activity: pelvic rest Diet: routine Medications: PNV, Ibuprofen and synthroid, and zoloft Condition: stable Instructions: refer to practice specific booklet Discharge to: home   Newborn Data: Live born female  Birth Weight: 5 lb 3.6 oz (2370 Skinner) APGAR: 9, 9  Home with mother.  Alyssa Skinner 05/14/2014, 8:09 AM

## 2014-05-20 ENCOUNTER — Ambulatory Visit (HOSPITAL_COMMUNITY): Admit: 2014-05-20 | Payer: 59

## 2014-06-23 ENCOUNTER — Other Ambulatory Visit: Payer: Self-pay | Admitting: Obstetrics and Gynecology

## 2014-06-24 LAB — CYTOLOGY - PAP

## 2014-10-01 ENCOUNTER — Encounter: Payer: Self-pay | Admitting: Physician Assistant

## 2014-10-01 ENCOUNTER — Ambulatory Visit (INDEPENDENT_AMBULATORY_CARE_PROVIDER_SITE_OTHER): Payer: 59 | Admitting: Physician Assistant

## 2014-10-01 VITALS — BP 126/80 | HR 64 | Temp 98.2°F | Resp 18 | Ht 69.25 in | Wt 166.0 lb

## 2014-10-01 DIAGNOSIS — F32A Depression, unspecified: Secondary | ICD-10-CM

## 2014-10-01 DIAGNOSIS — Z79899 Other long term (current) drug therapy: Secondary | ICD-10-CM

## 2014-10-01 DIAGNOSIS — F329 Major depressive disorder, single episode, unspecified: Secondary | ICD-10-CM

## 2014-10-01 DIAGNOSIS — E559 Vitamin D deficiency, unspecified: Secondary | ICD-10-CM

## 2014-10-01 DIAGNOSIS — E039 Hypothyroidism, unspecified: Secondary | ICD-10-CM

## 2014-10-01 DIAGNOSIS — F419 Anxiety disorder, unspecified: Secondary | ICD-10-CM

## 2014-10-01 DIAGNOSIS — Z1211 Encounter for screening for malignant neoplasm of colon: Secondary | ICD-10-CM | POA: Insufficient documentation

## 2014-10-01 LAB — BASIC METABOLIC PANEL WITH GFR
BUN: 8 mg/dL (ref 6–23)
CHLORIDE: 100 meq/L (ref 96–112)
CO2: 26 meq/L (ref 19–32)
CREATININE: 0.67 mg/dL (ref 0.50–1.10)
Calcium: 9.8 mg/dL (ref 8.4–10.5)
GFR, Est African American: >89 mL/min
GFR, Est Non African American: >89 mL/min
GLUCOSE: 91 mg/dL (ref 70–99)
Potassium: 5.1 mEq/L (ref 3.5–5.3)
Sodium: 139 mEq/L (ref 135–145)

## 2014-10-01 LAB — CBC WITH DIFFERENTIAL/PLATELET
BASOS PCT: 0 % (ref 0–1)
Basophils Absolute: 0 10*3/uL (ref 0.0–0.1)
Eosinophils Absolute: 0.2 10*3/uL (ref 0.0–0.7)
Eosinophils Relative: 4 % (ref 0–5)
HEMATOCRIT: 40.1 % (ref 36.0–46.0)
Hemoglobin: 13.2 g/dL (ref 12.0–15.0)
Lymphocytes Relative: 34 % (ref 12–46)
Lymphs Abs: 2.1 10*3/uL (ref 0.7–4.0)
MCH: 28.4 pg (ref 26.0–34.0)
MCHC: 32.9 g/dL (ref 30.0–36.0)
MCV: 86.4 fL (ref 78.0–100.0)
MONO ABS: 0.3 10*3/uL (ref 0.1–1.0)
MONOS PCT: 5 % (ref 3–12)
MPV: 10.8 fL (ref 8.6–12.4)
NEUTROS ABS: 3.5 10*3/uL (ref 1.7–7.7)
Neutrophils Relative %: 57 % (ref 43–77)
Platelets: 282 10*3/uL (ref 150–400)
RBC: 4.64 MIL/uL (ref 3.87–5.11)
RDW: 13.9 % (ref 11.5–15.5)
WBC: 6.2 10*3/uL (ref 4.0–10.5)

## 2014-10-01 LAB — HEPATIC FUNCTION PANEL
ALT: 10 U/L (ref 0–35)
AST: 10 U/L (ref 0–37)
Albumin: 4.1 g/dL (ref 3.5–5.2)
Alkaline Phosphatase: 61 U/L (ref 39–117)
BILIRUBIN DIRECT: 0.1 mg/dL (ref 0.0–0.3)
BILIRUBIN INDIRECT: 0.3 mg/dL (ref 0.2–1.2)
TOTAL PROTEIN: 7 g/dL (ref 6.0–8.3)
Total Bilirubin: 0.4 mg/dL (ref 0.2–1.2)

## 2014-10-01 LAB — TSH: TSH: 0.255 u[IU]/mL — AB (ref 0.350–4.500)

## 2014-10-01 LAB — MAGNESIUM: Magnesium: 1.7 mg/dL (ref 1.5–2.5)

## 2014-10-01 MED ORDER — ALPRAZOLAM 0.5 MG PO TABS: ORAL_TABLET | ORAL | Status: DC

## 2014-10-01 NOTE — Patient Instructions (Signed)
Hypothyroidism The thyroid is a large gland located in the lower front of your neck. The thyroid gland helps control metabolism. Metabolism is how your body handles food. It controls metabolism with the hormone thyroxine. When this gland is underactive (hypothyroid), it produces too little hormone.  CAUSES These include:   Absence or destruction of thyroid tissue.  Goiter due to iodine deficiency.  Goiter due to medications.  Congenital defects (since birth).  Problems with the pituitary. This causes a lack of TSH (thyroid stimulating hormone). This hormone tells the thyroid to turn out more hormone. SYMPTOMS  Lethargy (feeling as though you have no energy)  Cold intolerance  Weight gain (in spite of normal food intake)  Dry skin  Coarse hair  Menstrual irregularity (if severe, may lead to infertility)  Slowing of thought processes Cardiac problems are also caused by insufficient amounts of thyroid hormone. Hypothyroidism in the newborn is cretinism, and is an extreme form. It is important that this form be treated adequately and immediately or it will lead rapidly to retarded physical and mental development. DIAGNOSIS  To prove hypothyroidism, your caregiver may do blood tests and ultrasound tests. Sometimes the signs are hidden. It may be necessary for your caregiver to watch this illness with blood tests either before or after diagnosis and treatment. TREATMENT  Low levels of thyroid hormone are increased by using synthetic thyroid hormone. This is a safe, effective treatment. It usually takes about four weeks to gain the full effects of the medication. After you have the full effect of the medication, it will generally take another four weeks for problems to leave. Your caregiver may start you on low doses. If you have had heart problems the dose may be gradually increased. It is generally not an emergency to get rapidly to normal. HOME CARE INSTRUCTIONS   Take your  medications as your caregiver suggests. Let your caregiver know of any medications you are taking or start taking. Your caregiver will help you with dosage schedules.  As your condition improves, your dosage needs may increase. It will be necessary to have continuing blood tests as suggested by your caregiver.  Report all suspected medication side effects to your caregiver. SEEK MEDICAL CARE IF: Seek medical care if you develop:  Sweating.  Tremulousness (tremors).  Anxiety.  Rapid weight loss.  Heat intolerance.  Emotional swings.  Diarrhea.  Weakness. SEEK IMMEDIATE MEDICAL CARE IF:  You develop chest pain, an irregular heart beat (palpitations), or a rapid heart beat. MAKE SURE YOU:   Understand these instructions.  Will watch your condition.  Will get help right away if you are not doing well or get worse. Document Released: 03/21/2005 Document Revised: 06/13/2011 Document Reviewed: 11/09/2007 Exit[employer] Patient Information 2015 [employer]. This information is not intended to replace advice given to you by your health care provider. Make sure you discuss any questions you have with your health care provider.  

## 2014-10-01 NOTE — Progress Notes (Signed)
Assessment and Plan: 1. Hypothyroidism, unspecified hypothyroidism type Recent change in dose and she is taking an 88mcg and 100mcg, if she is doing well will combine or change dose.  - TSH  2. Depression Depression- continue medications, stress management techniques discussed, increase water, good sleep hygiene discussed, increase exercise, and increase veggies.   3. Anxiety continue medications, xanax sent in since she is no longer breast feeding, takes PRN, stress management techniques discussed, increase water, good sleep hygiene discussed, increase exercise, and increase veggies.   4. Vitamin D deficiency - Vit D  25 hydroxy (rtn osteoporosis monitoring)  5. Medication management - CBC with Differential/Platelet - BASIC METABOLIC PANEL WITH GFR - Hepatic function panel - Magnesium    HPI 39 y.o.female with history of depression, anxiety, and thyroid presents for over due follow up. She was lost to follow up during pregnancy, has 34 month old girl isabella and has son conner.  Her TSH was checked at OB/GYN 3 months ago, and her synthroid was increased to 188mcg. She is not breast feeding currently. She would like a refill for xanax, she is going out of town for the first time this week with her baby and is nervous. She is still on zoloft, decreased to 50 during pregnancy and will stay on this amount.    Past Medical History  Diagnosis Date  . Hypothyroid   . Depression   . Anxiety      No Known Allergies    Current Outpatient Prescriptions on File Prior to Visit  Medication Sig Dispense Refill  . ibuprofen (ADVIL,MOTRIN) 600 MG tablet Take 1 tablet (600 mg total) by mouth every 6 (six) hours. 30 tablet 1  . levothyroxine (SYNTHROID, LEVOTHROID) 100 MCG tablet Take 100 mcg by mouth daily before breakfast. Take with 88mcg daily.    Marland Kitchen. levothyroxine (SYNTHROID, LEVOTHROID) 88 MCG tablet Take 88 mcg by mouth daily before breakfast. Take with 100mcg daily.    . sertraline  (ZOLOFT) 100 MG tablet Take 50 mg by mouth daily.     No current facility-administered medications on file prior to visit.    ROS: all negative except above.   Physical Exam: Filed Weights   10/01/14 0938  Weight: 166 lb (75.297 kg)   BP 126/80 mmHg  Pulse 64  Temp(Src) 98.2 F (36.8 C) (Temporal)  Resp 18  Ht 5' 9.25" (1.759 m)  Wt 166 lb (75.297 kg)  BMI 24.34 kg/m2  LMP 10/01/2014 General Appearance: Well nourished, in no apparent distress. Eyes: PERRLA, EOMs, conjunctiva no swelling or erythema Sinuses: No Frontal/maxillary tenderness ENT/Mouth: Ext aud canals clear, TMs without erythema, bulging. No erythema, swelling, or exudate on post pharynx.  Tonsils not swollen or erythematous. Hearing normal.  Neck: Supple, thyroid normal.  Respiratory: Respiratory effort normal, BS equal bilaterally without rales, rhonchi, wheezing or stridor.  Cardio: RRR with no MRGs. Brisk peripheral pulses without edema.  Abdomen: Soft, + BS.  Non tender, no guarding, rebound, hernias, masses. Lymphatics: Non tender without lymphadenopathy.  Musculoskeletal: Full ROM, 5/5 strength, normal gait.  Skin: Warm, dry without rashes, lesions, ecchymosis.  Neuro: Cranial nerves intact. Normal muscle tone, no cerebellar symptoms. Sensation intact.  Psych: Awake and oriented X 3, normal affect, Insight and Judgment appropriate.     Quentin MullingAmanda Elric Tirado, PA-C 9:48 AM [facility]

## 2014-10-02 LAB — VITAMIN D 25 HYDROXY (VIT D DEFICIENCY, FRACTURES): VIT D 25 HYDROXY: 25 ng/mL — AB (ref 30–100)

## 2014-10-29 ENCOUNTER — Other Ambulatory Visit: Payer: Self-pay

## 2014-11-04 ENCOUNTER — Other Ambulatory Visit: Payer: 59

## 2014-11-04 DIAGNOSIS — E039 Hypothyroidism, unspecified: Secondary | ICD-10-CM

## 2014-11-04 LAB — TSH: TSH: 8.02 u[IU]/mL — AB (ref 0.350–4.500)

## 2014-11-07 ENCOUNTER — Other Ambulatory Visit: Payer: Self-pay | Admitting: Physician Assistant

## 2014-11-07 DIAGNOSIS — F419 Anxiety disorder, unspecified: Secondary | ICD-10-CM

## 2014-11-10 NOTE — Telephone Encounter (Signed)
Called Rx into [facility] 

## 2014-11-26 ENCOUNTER — Other Ambulatory Visit: Payer: Self-pay

## 2014-11-26 MED ORDER — LEVOTHYROXINE SODIUM 150 MCG PO TABS: 150.0000 ug | ORAL_TABLET | Freq: Every day | ORAL | Status: DC

## 2014-12-10 ENCOUNTER — Other Ambulatory Visit: Payer: Self-pay

## 2014-12-25 ENCOUNTER — Other Ambulatory Visit: Payer: Self-pay

## 2014-12-29 ENCOUNTER — Other Ambulatory Visit: Payer: 59

## 2014-12-30 LAB — TSH: TSH: 0.532 u[IU]/mL (ref 0.350–4.500)

## 2015-01-12 ENCOUNTER — Other Ambulatory Visit: Payer: Self-pay | Admitting: Physician Assistant

## 2015-01-26 ENCOUNTER — Encounter: Payer: Self-pay | Admitting: Physician Assistant

## 2015-01-26 ENCOUNTER — Ambulatory Visit (INDEPENDENT_AMBULATORY_CARE_PROVIDER_SITE_OTHER): Payer: 59 | Admitting: Physician Assistant

## 2015-01-26 VITALS — BP 110/60 | HR 90 | Temp 97.0°F | Resp 16 | Ht 69.25 in | Wt 158.0 lb

## 2015-01-26 DIAGNOSIS — R197 Diarrhea, unspecified: Secondary | ICD-10-CM

## 2015-01-26 DIAGNOSIS — E039 Hypothyroidism, unspecified: Secondary | ICD-10-CM | POA: Diagnosis not present

## 2015-01-26 DIAGNOSIS — R1031 Right lower quadrant pain: Secondary | ICD-10-CM

## 2015-01-26 DIAGNOSIS — Z79899 Other long term (current) drug therapy: Secondary | ICD-10-CM

## 2015-01-26 DIAGNOSIS — G8929 Other chronic pain: Secondary | ICD-10-CM

## 2015-01-26 LAB — HEPATIC FUNCTION PANEL
ALT: 24 U/L (ref 6–29)
AST: 15 U/L (ref 10–30)
Albumin: 4.3 g/dL (ref 3.6–5.1)
Alkaline Phosphatase: 48 U/L (ref 33–115)
BILIRUBIN INDIRECT: 0.6 mg/dL (ref 0.2–1.2)
BILIRUBIN TOTAL: 0.7 mg/dL (ref 0.2–1.2)
Bilirubin, Direct: 0.1 mg/dL (ref <=0.2)
Total Protein: 7.2 g/dL (ref 6.1–8.1)

## 2015-01-26 LAB — BASIC METABOLIC PANEL WITH GFR
BUN: 7 mg/dL (ref 7–25)
CALCIUM: 9.9 mg/dL (ref 8.6–10.2)
CO2: 25 mmol/L (ref 20–31)
Chloride: 105 mmol/L (ref 98–110)
Creat: 0.66 mg/dL (ref 0.50–1.10)
Glucose, Bld: 110 mg/dL — ABNORMAL HIGH (ref 65–99)
Potassium: 3.5 mmol/L (ref 3.5–5.3)
Sodium: 141 mmol/L (ref 135–146)

## 2015-01-26 LAB — CBC WITH DIFFERENTIAL/PLATELET
BASOS ABS: 0 10*3/uL (ref 0.0–0.1)
BASOS PCT: 0 % (ref 0–1)
EOS ABS: 0.1 10*3/uL (ref 0.0–0.7)
EOS PCT: 1 % (ref 0–5)
HEMATOCRIT: 38.5 % (ref 36.0–46.0)
Hemoglobin: 12.9 g/dL (ref 12.0–15.0)
LYMPHS PCT: 32 % (ref 12–46)
Lymphs Abs: 2 10*3/uL (ref 0.7–4.0)
MCH: 28.7 pg (ref 26.0–34.0)
MCHC: 33.5 g/dL (ref 30.0–36.0)
MCV: 85.6 fL (ref 78.0–100.0)
MPV: 11.2 fL (ref 8.6–12.4)
Monocytes Absolute: 0.3 10*3/uL (ref 0.1–1.0)
Monocytes Relative: 5 % (ref 3–12)
Neutro Abs: 3.8 10*3/uL (ref 1.7–7.7)
Neutrophils Relative %: 62 % (ref 43–77)
PLATELETS: 297 10*3/uL (ref 150–400)
RBC: 4.5 MIL/uL (ref 3.87–5.11)
RDW: 12.9 % (ref 11.5–15.5)
WBC: 6.2 10*3/uL (ref 4.0–10.5)

## 2015-01-26 LAB — TSH: TSH: 1.241 u[IU]/mL (ref 0.350–4.500)

## 2015-01-26 LAB — MAGNESIUM: MAGNESIUM: 1.9 mg/dL (ref 1.5–2.5)

## 2015-01-26 MED ORDER — HYOSCYAMINE SULFATE 0.125 MG PO TABS: 0.1250 mg | ORAL_TABLET | ORAL | Status: DC | PRN

## 2015-01-26 NOTE — Patient Instructions (Addendum)
Alyssa Skinner    Viral Gastroenteritis Viral gastroenteritis is also called stomach flu. This illness is caused by a certain type of germ (virus). It can cause sudden watery poop (diarrhea) and throwing up (vomiting). This can cause you to lose body fluids (dehydration). This illness usually lasts for 3 to 8 days. It usually goes away on its own. HOME CARE   Drink enough fluids to keep your pee (urine) clear or pale yellow. Drink small amounts of fluids often.  Ask your doctor how to replace body fluid losses (rehydration).  Avoid:  Foods high in sugar.  Alcohol.  Bubbly (carbonated) drinks.  Tobacco.  Juice.  Caffeine drinks.  Very hot or cold fluids.  Fatty, greasy foods.  Eating too much at one time.  Dairy products until 24 to 48 hours after your watery poop stops.  You may eat foods with active cultures (probiotics). They can be found in some yogurts and supplements.  Wash your hands well to avoid spreading the illness.  Only take medicines as told by your doctor. Do not give aspirin to children. Do not take medicines for watery poop (antidiarrheals).  Ask your doctor if you should keep taking your regular medicines.  Keep all doctor visits as told. GET HELP RIGHT AWAY IF:   You cannot keep fluids down.  You do not pee at least once every 6 to 8 hours.  You are short of breath.  You see blood in your poop or throw up. This may look like coffee grounds.  You have belly (abdominal) pain that gets worse or is just in one small spot (localized).  You keep throwing up or having watery poop.  You have a fever.  The patient is a child younger than 3 months, and he or she has a fever.  The patient is a child older than 3 months, and he or she has a fever and problems that do not go away.  The patient is a child older than 3 months, and he or she has a fever and problems that suddenly get worse.  The patient is a baby, and he or she has no tears when  crying. MAKE SURE YOU:   Understand these instructions.  Will watch your condition.  Will get help right away if you are not doing well or get worse.   This information is not intended to replace advice given to you by your health care provider. Make sure you discuss any questions you have with your health care provider.   Document Released: 09/07/2007 Document Revised: 06/13/2011 Document Reviewed: 01/05/2011 Elsevier Interactive Patient Education [employer]2016 [employer] Inc.

## 2015-01-26 NOTE — Progress Notes (Signed)
   Subjective:    Patient ID: Alyssa Skinner, female    DOB: 04/25/1975, 39 y.o.   MRN: [id number]  HPI 39 y.o. WF presents with fatigue, diarrhea. She states while at the beach a week ago, she had fatigue, diarrhea, nausea with dry heaving, decreased appetite, no AB pain. Went to UC and had negative preg test and had phenergan. No one else sick around her. She has had chills but no subjective fever.   She is on the thyroid 150mcg daily from 144.   Blood pressure 110/60, pulse 90, temperature 97 F (36.1 C), temperature source Temporal, resp. rate 16, height 5' 9.25" (1.759 m), weight 158 lb (71.668 kg), last menstrual period 12/27/2014, SpO2 97 %, unknown if currently breastfeeding.  Current Outpatient Prescriptions on File Prior to Visit  Medication Sig Dispense Refill  . ALPRAZolam (XANAX) 0.5 MG tablet TAKE 1 TABLET TWICE A DAY 90 tablet 0  . ibuprofen (ADVIL,MOTRIN) 600 MG tablet Take 1 tablet (600 mg total) by mouth every 6 (six) hours. 30 tablet 1  . levothyroxine (SYNTHROID) 150 MCG tablet Take 1 tablet (150 mcg total) by mouth daily. 30 tablet 11  . norethindrone-ethinyl estradiol (CYCLAFEM,ALYACEN) 0.5/0.75/1-35 MG-MCG tablet Take 1 tablet by mouth daily.    . sertraline (ZOLOFT) 100 MG tablet Take 50 mg by mouth daily.     No current facility-administered medications on file prior to visit.   Past Medical History  Diagnosis Date  . Hypothyroid   . Depression   . Anxiety     Review of Systems  Constitutional: Positive for chills, activity change, appetite change and fatigue. Negative for fever, diaphoresis and unexpected weight change.  HENT: Negative.   Respiratory: Negative.  Negative for shortness of breath.   Cardiovascular: Negative.  Negative for chest pain.  Gastrointestinal: Positive for nausea and diarrhea. Negative for vomiting, abdominal pain, constipation, blood in stool, abdominal distention, anal bleeding and rectal pain.  Genitourinary: Negative.    Musculoskeletal: Negative.   Neurological: Negative.        Objective:   Physical Exam  Constitutional: She is oriented to person, place, and time. She appears well-developed and well-nourished.  HENT:  Head: Normocephalic and atraumatic.  Eyes: Conjunctivae are normal. Pupils are equal, round, and reactive to light.  Neck: Normal range of motion. Neck supple.  Cardiovascular: Normal rate and regular rhythm.   Pulmonary/Chest: Effort normal and breath sounds normal.  Abdominal: Soft. Bowel sounds are normal. She exhibits no distension and no mass. There is tenderness. There is tenderness at McBurney's point. There is no rigidity, no rebound, no guarding and negative Murphy's sign.  Neurological: She is alert and oriented to person, place, and time.       Assessment & Plan:  RLQ pain, no peritoneal signs- check labs, follow up closely- at this time does not appear to be appendicitis but will follow up closely to monitor for any evolution of symptoms/signs Diarrhea/Nausea- check labs, may need to get stool samples  Fatigue- check labs, ? Viral syndrome, versus infectious diarrhea.

## 2015-01-27 LAB — HEPATITIS PANEL, ACUTE
HCV AB: NEGATIVE
HEP B C IGM: NONREACTIVE
HEP B S AG: NEGATIVE
Hep A IgM: NONREACTIVE

## 2015-01-29 ENCOUNTER — Other Ambulatory Visit: Payer: Self-pay | Admitting: Physician Assistant

## 2015-01-29 DIAGNOSIS — R197 Diarrhea, unspecified: Secondary | ICD-10-CM

## 2015-02-05 ENCOUNTER — Encounter: Payer: Self-pay | Admitting: Physician Assistant

## 2015-02-13 ENCOUNTER — Other Ambulatory Visit: Payer: Self-pay | Admitting: Physician Assistant

## 2015-02-16 ENCOUNTER — Other Ambulatory Visit: Payer: Self-pay | Admitting: *Deleted

## 2015-02-16 MED ORDER — ALPRAZOLAM 0.5 MG PO TABS: 0.5000 mg | ORAL_TABLET | Freq: Two times a day (BID) | ORAL | Status: DC

## 2015-03-16 ENCOUNTER — Other Ambulatory Visit: Payer: Self-pay | Admitting: Physician Assistant

## 2015-03-16 NOTE — Telephone Encounter (Signed)
[facility] 

## 2015-03-23 ENCOUNTER — Ambulatory Visit (INDEPENDENT_AMBULATORY_CARE_PROVIDER_SITE_OTHER): Payer: 59 | Admitting: Internal Medicine

## 2015-03-23 ENCOUNTER — Encounter: Payer: Self-pay | Admitting: Internal Medicine

## 2015-03-23 VITALS — BP 122/68 | HR 68 | Temp 98.0°F | Resp 18 | Ht 69.0 in | Wt 165.0 lb

## 2015-03-23 DIAGNOSIS — Z131 Encounter for screening for diabetes mellitus: Secondary | ICD-10-CM

## 2015-03-23 DIAGNOSIS — Z130 Encounter for screening for diseases of the blood and blood-forming organs and certain disorders involving the immune mechanism: Secondary | ICD-10-CM

## 2015-03-23 DIAGNOSIS — Z0000 Encounter for general adult medical examination without abnormal findings: Secondary | ICD-10-CM

## 2015-03-23 DIAGNOSIS — E039 Hypothyroidism, unspecified: Secondary | ICD-10-CM

## 2015-03-23 DIAGNOSIS — I10 Essential (primary) hypertension: Secondary | ICD-10-CM | POA: Diagnosis not present

## 2015-03-23 DIAGNOSIS — Z79899 Other long term (current) drug therapy: Secondary | ICD-10-CM

## 2015-03-23 DIAGNOSIS — Z136 Encounter for screening for cardiovascular disorders: Secondary | ICD-10-CM

## 2015-03-23 DIAGNOSIS — Z13220 Encounter for screening for lipoid disorders: Secondary | ICD-10-CM

## 2015-03-23 DIAGNOSIS — Z0001 Encounter for general adult medical examination with abnormal findings: Secondary | ICD-10-CM

## 2015-03-23 DIAGNOSIS — Z1389 Encounter for screening for other disorder: Secondary | ICD-10-CM

## 2015-03-23 DIAGNOSIS — F419 Anxiety disorder, unspecified: Secondary | ICD-10-CM

## 2015-03-23 DIAGNOSIS — E559 Vitamin D deficiency, unspecified: Secondary | ICD-10-CM

## 2015-03-23 LAB — CBC WITH DIFFERENTIAL/PLATELET
BASOS ABS: 0.1 10*3/uL (ref 0.0–0.1)
BASOS PCT: 1 % (ref 0–1)
Eosinophils Absolute: 0.3 10*3/uL (ref 0.0–0.7)
Eosinophils Relative: 4 % (ref 0–5)
HCT: 36.5 % (ref 36.0–46.0)
Hemoglobin: 12.6 g/dL (ref 12.0–15.0)
Lymphocytes Relative: 32 % (ref 12–46)
Lymphs Abs: 2.4 10*3/uL (ref 0.7–4.0)
MCH: 29.3 pg (ref 26.0–34.0)
MCHC: 34.5 g/dL (ref 30.0–36.0)
MCV: 84.9 fL (ref 78.0–100.0)
MPV: 10.7 fL (ref 8.6–12.4)
Monocytes Absolute: 0.5 10*3/uL (ref 0.1–1.0)
Monocytes Relative: 7 % (ref 3–12)
NEUTROS ABS: 4.3 10*3/uL (ref 1.7–7.7)
NEUTROS PCT: 56 % (ref 43–77)
Platelets: 305 10*3/uL (ref 150–400)
RBC: 4.3 MIL/uL (ref 3.87–5.11)
RDW: 13.9 % (ref 11.5–15.5)
WBC: 7.6 10*3/uL (ref 4.0–10.5)

## 2015-03-23 NOTE — Progress Notes (Signed)
Patient ID: Alyssa Skinner, female   DOB: Jul 14, 1975, 39 y.o.   MRN: [id number]    Annual Screening Comprehensive Examination   This very nice 39 y.o.female presents for complete physical.  Patient has no major health issues.  Patient reports no complaints at this time.   Finally, patient has history of Vitamin D Deficiency and last vitamin D was  Lab Results  Component Value Date   VD25OH 25* 10/01/2014  .  Currently on supplementation. She takes 1,000 mg daily.  She is current seeing Dr. Runell [facility] at [facility].  She had a pap done on 06/23/14.  She reports that periods are normal.  She is taking orthotricyclin.  She reports no side effects.    She does have a history of anxiety.  She does take xanax as needed.  She reports that it has been very stable.  She has not had any panic attacks.    She does take levothyroxine daily for hypothyroidism.  She hasn't missed any doses that she can think of.    She does have an aunt who was diagnosed with colon cancer.  She does have a cousin with breast cancer.      Current Outpatient Prescriptions on File Prior to Visit  Medication Sig Dispense Refill  . ALPRAZolam (XANAX) 0.5 MG tablet TAKE 1 TABLET BY MOUTH TWICE A DAY AS NEEDED 60 tablet 0  . levothyroxine (SYNTHROID) 150 MCG tablet Take 1 tablet (150 mcg total) by mouth daily. 30 tablet 11  . norethindrone-ethinyl estradiol (CYCLAFEM,ALYACEN) 0.5/0.75/1-35 MG-MCG tablet Take 1 tablet by mouth daily.    . sertraline (ZOLOFT) 100 MG tablet Take 50 mg by mouth daily.     No current facility-administered medications on file prior to visit.    No Known Allergies  Past Medical History  Diagnosis Date  . Hypothyroid   . Depression   . Anxiety     Immunization History  Administered Date(s) Administered  . MMR 05/14/2014    Past Surgical History  Procedure Laterality Date  . Heart chamber revision  1979    Family History  Problem Relation Age of Onset  . Hyperlipidemia  Mother   . Hyperlipidemia Father     Social History   Social History  . Marital Status: Married    Spouse Name: N/A  . Number of Children: N/A  . Years of Education: N/A   Occupational History  . Not on file.   Social History Main Topics  . Smoking status: Former Smoker    Quit date: 08/02/2013  . Smokeless tobacco: Never Used  . Alcohol Use: 0.0 oz/week    0 Standard drinks or equivalent per week     Comment: Rare wen not pregnant  . Drug Use: No  . Sexual Activity: Yes    Birth Control/ Protection: None   Other Topics Concern  . Not on file   Social History Narrative   Review of Systems  Constitutional: Negative for fever, chills and malaise/fatigue.  HENT: Positive for congestion. Negative for ear pain and sore throat.   Respiratory: Negative for cough, shortness of breath and wheezing.   Cardiovascular: Negative for chest pain, palpitations and leg swelling.  Gastrointestinal: Negative for heartburn, diarrhea, constipation, blood in stool and melena.  Genitourinary: Negative.   Skin: Negative.   Neurological: Negative for dizziness, sensory change, loss of consciousness and headaches.  Psychiatric/Behavioral: Negative for depression. The patient is nervous/anxious. The patient does not have insomnia.       Physical  Exam  BP 122/68 mmHg  Pulse 68  Temp(Src) 98 F (36.7 C) (Temporal)  Resp 18  Ht '5\' 9"'  (1.753 m)  Wt 165 lb (74.844 kg)  BMI 24.36 kg/m2  LMP 03/02/2015  General Appearance: Well nourished and in no apparent distress. Eyes: PERRLA, EOMs, conjunctiva no swelling or erythema, normal fundi and vessels. Sinuses: No frontal/maxillary tenderness ENT/Mouth: EACs patent / TMs  nl. Nares clear without erythema, swelling, mucoid exudates. Oral hygiene is good. No erythema, swelling, or exudate. Tongue normal, non-obstructing. Tonsils not swollen or erythematous. Hearing normal.  Neck: Supple, thyroid normal. No bruits, nodes or JVD. Respiratory:  Respiratory effort normal.  BS equal and clear bilateral without rales, rhonci, wheezing or stridor. Cardio: Heart sounds are normal with regular rate and rhythm and no murmurs, rubs or gallops. Peripheral pulses are normal and equal bilaterally without edema. No aortic or femoral bruits. Chest: symmetric with normal excursions and percussion. Breasts: Symmetric, without lumps, nipple discharge, retractions, or fibrocystic changes.  Abdomen: Flat, soft, with bowl sounds. Nontender, no guarding, rebound, hernias, masses, or organomegaly.  Lymphatics: Non tender without lymphadenopathy.  Genitourinary:  Musculoskeletal: Full ROM all peripheral extremities, joint stability, 5/5 strength, and normal gait. Skin: Warm and dry without rashes, lesions, cyanosis, clubbing or  ecchymosis.  Neuro: Cranial nerves intact, reflexes equal bilaterally. Normal muscle tone, no cerebellar symptoms. Sensation intact.  Pysch: Awake and oriented X 3, normal affect, Insight and Judgment appropriate.   Assessment and Plan    1. Encounter for general adult medical examination with abnormal findings   2. Hypothyroidism, unspecified hypothyroidism type  - TSH  3. Anxiety -cont zoloft -cont xanax prn  4. Vitamin D deficiency -cont supplement - VITAMIN D 25 Hydroxy (Vit-D Deficiency, Fractures)  5. Medication management  - CBC with Differential/Platelet - BASIC METABOLIC PANEL WITH GFR - Hepatic function panel - Magnesium  6. Screening for diabetes mellitus  - Hemoglobin A1c - Insulin, random  7. Screening for hyperlipidemia  - Lipid panel  8. Screening for cardiovascular condition  - EKG 12-Lead  9. Screening, deficiency anemia, iron  - Iron and TIBC - Vitamin B12  10. Screening for hematuria or proteinuria  - Urinalysis, Routine w reflex microscopic (not at [facility]) - Microalbumin / creatinine urine ratio  Continue prudent diet as discussed, weight control, regular exercise, and  medications. Routine screening labs and tests as requested with regular follow-up as recommended.  Over 40 minutes of exam, counseling, chart review and critical decision making was performed

## 2015-03-23 NOTE — Patient Instructions (Signed)
Preventive Care for Adults  A healthy lifestyle and preventive care can promote health and wellness. Preventive health guidelines for women include the following key practices.  A routine yearly physical is a good way to check with your health care provider about your health and preventive screening. It is a chance to share any concerns and updates on your health and to receive a thorough exam.  Visit your dentist for a routine exam and preventive care every 6 months. Brush your teeth twice a day and floss once a day. Good oral hygiene prevents tooth decay and gum disease.  The frequency of eye exams is based on your age, health, family medical history, use of contact lenses, and other factors. Follow your health care provider's recommendations for frequency of eye exams.  Eat a healthy diet. Foods like vegetables, fruits, whole grains, low-fat dairy products, and lean protein foods contain the nutrients you need without too many calories. Decrease your intake of foods high in solid fats, added sugars, and salt. Eat the right amount of calories for you.Get information about a proper diet from your health care provider, if necessary.  Regular physical exercise is one of the most important things you can do for your health. Most adults should get at least 150 minutes of moderate-intensity exercise (any activity that increases your heart rate and causes you to sweat) each week. In addition, most adults need muscle-strengthening exercises on 2 or more days a week.  Maintain a healthy weight. The body mass index (BMI) is a screening tool to identify possible weight problems. It provides an estimate of body fat based on height and weight. Your health care provider can find your BMI and can help you achieve or maintain a healthy weight.For adults 20 years and older:  A BMI below 18.5 is considered underweight.  A BMI of 18.5 to 24.9 is normal.  A BMI of 25 to 29.9 is considered overweight.  A BMI  of 30 and above is considered obese.  Maintain normal blood lipids and cholesterol levels by exercising and minimizing your intake of saturated fat. Eat a balanced diet with plenty of fruit and vegetables. Blood tests for lipids and cholesterol should begin at age 27 and be repeated every 5 years. If your lipid or cholesterol levels are high, you are over 50, or you are at high risk for heart disease, you may need your cholesterol levels checked more frequently.Ongoing high lipid and cholesterol levels should be treated with medicines if diet and exercise are not working.  If you smoke, find out from your health care provider how to quit. If you do not use tobacco, do not start.  Lung cancer screening is recommended for adults aged 3-80 years who are at high risk for developing lung cancer because of a history of smoking. A yearly low-dose CT scan of the lungs is recommended for people who have at least a 30-pack-year history of smoking and are a current smoker or have quit within the past 15 years. A pack year of smoking is smoking an average of 1 pack of cigarettes a day for 1 year (for example: 1 pack a day for 30 years or 2 packs a day for 15 years). Yearly screening should continue until the smoker has stopped smoking for at least 15 years. Yearly screening should be stopped for people who develop a health problem that would prevent them from having lung cancer treatment.  If you are pregnant, do not drink alcohol. If you are  breastfeeding, be very cautious about drinking alcohol. If you are not pregnant and choose to drink alcohol, do not have more than 1 drink per day. One drink is considered to be 12 ounces (355 mL) of beer, 5 ounces (148 mL) of wine, or 1.5 ounces (44 mL) of liquor.  Avoid use of street drugs. Do not share needles with anyone. Ask for help if you need support or instructions about stopping the use of drugs.  High blood pressure causes heart disease and increases the risk of  stroke. Your blood pressure should be checked at least every 1 to 2 years. Ongoing high blood pressure should be treated with medicines if weight loss and exercise do not work.  If you are 3-31 years old, ask your health care provider if you should take aspirin to prevent strokes.  Diabetes screening involves taking a blood sample to check your fasting blood sugar level. This should be done once every 3 years, after age 31, if you are within normal weight and without risk factors for diabetes. Testing should be considered at a younger age or be carried out more frequently if you are overweight and have at least 1 risk factor for diabetes.  Breast cancer screening is essential preventive care for women. You should practice "breast self-awareness." This means understanding the normal appearance and feel of your breasts and may include breast self-examination. Any changes detected, no matter how small, should be reported to a health care provider. Women in their 76s and 30s should have a clinical breast exam (CBE) by a health care provider as part of a regular health exam every 1 to 3 years. After age 65, women should have a CBE every year. Starting at age 67, women should consider having a mammogram (breast X-ray test) every year. Women who have a family history of breast cancer should talk to their health care provider about genetic screening. Women at a high risk of breast cancer should talk to their health care providers about having an MRI and a mammogram every year.  Breast cancer gene (BRCA)-related cancer risk assessment is recommended for women who have family members with BRCA-related cancers. BRCA-related cancers include breast, ovarian, tubal, and peritoneal cancers. Having family members with these cancers may be associated with an increased risk for harmful changes (mutations) in the breast cancer genes BRCA1 and BRCA2. Results of the assessment will determine the need for genetic counseling and  BRCA1 and BRCA2 testing.  Routine pelvic exams to screen for cancer are no longer recommended for nonpregnant women who are considered low risk for cancer of the pelvic organs (ovaries, uterus, and vagina) and who do not have symptoms. Ask your health care provider if a screening pelvic exam is right for you.  If you have had past treatment for cervical cancer or a condition that could lead to cancer, you need Pap tests and screening for cancer for at least 20 years after your treatment. If Pap tests have been discontinued, your risk factors (such as having a new sexual partner) need to be reassessed to determine if screening should be resumed. Some women have medical problems that increase the chance of getting cervical cancer. In these cases, your health care provider may recommend more frequent screening and Pap tests.  The HPV test is an additional test that may be used for cervical cancer screening. The HPV test looks for the virus that can cause the cell changes on the cervix. The cells collected during the Pap test can  be tested for HPV. The HPV test could be used to screen women aged 98 years and older, and should be used in women of any age who have unclear Pap test results. After the age of 59, women should have HPV testing at the same frequency as a Pap test.  Colorectal cancer can be detected and often prevented. Most routine colorectal cancer screening begins at the age of 35 years and continues through age 61 years. However, your health care provider may recommend screening at an earlier age if you have risk factors for colon cancer. On a yearly basis, your health care provider may provide home test kits to check for hidden blood in the stool. Use of a small camera at the end of a tube, to directly examine the colon (sigmoidoscopy or colonoscopy), can detect the earliest forms of colorectal cancer. Talk to your health care provider about this at age 18, when routine screening begins. Direct  exam of the colon should be repeated every 5-10 years through age 67 years, unless early forms of pre-cancerous polyps or small growths are found.  People who are at an increased risk for hepatitis B should be screened for this virus. You are considered at high risk for hepatitis B if:  You were born in a country where hepatitis B occurs often. Talk with your health care provider about which countries are considered high risk.  Your parents were born in a high-risk country and you have not received a shot to protect against hepatitis B (hepatitis B vaccine).  You have HIV or AIDS.  You use needles to inject street drugs.  You live with, or have sex with, someone who has hepatitis B.  You get hemodialysis treatment.  You take certain medicines for conditions like cancer, organ transplantation, and autoimmune conditions.  Hepatitis C blood testing is recommended for all people born from [birth year] through 1965 and any individual with known risks for hepatitis C.  Practice safe sex. Use condoms and avoid high-risk sexual practices to reduce the spread of sexually transmitted infections (STIs). STIs include gonorrhea, chlamydia, syphilis, trichomonas, herpes, HPV, and human immunodeficiency virus (HIV). Herpes, HIV, and HPV are viral illnesses that have no cure. They can result in disability, cancer, and death.  You should be screened for sexually transmitted illnesses (STIs) including gonorrhea and chlamydia if:  You are sexually active and are younger than 24 years.  You are older than 24 years and your health care provider tells you that you are at risk for this type of infection.  Your sexual activity has changed since you were last screened and you are at an increased risk for chlamydia or gonorrhea. Ask your health care provider if you are at risk.  If you are at risk of being infected with HIV, it is recommended that you take a prescription medicine daily to prevent HIV infection. This is  called preexposure prophylaxis (PrEP). You are considered at risk if:  You are a heterosexual woman, are sexually active, and are at increased risk for HIV infection.  You take drugs by injection.  You are sexually active with a partner who has HIV.  Talk with your health care provider about whether you are at high risk of being infected with HIV. If you choose to begin PrEP, you should first be tested for HIV. You should then be tested every 3 months for as long as you are taking PrEP.  Osteoporosis is a disease in which the bones lose minerals and  strength with aging. This can result in serious bone fractures or breaks. The risk of osteoporosis can be identified using a bone density scan. Women ages 48 years and over and women at risk for fractures or osteoporosis should discuss screening with their health care providers. Ask your health care provider whether you should take a calcium supplement or vitamin D to reduce the rate of osteoporosis.  Menopause can be associated with physical symptoms and risks. Hormone replacement therapy is available to decrease symptoms and risks. You should talk to your health care provider about whether hormone replacement therapy is right for you.  Use sunscreen. Apply sunscreen liberally and repeatedly throughout the day. You should seek shade when your shadow is shorter than you. Protect yourself by wearing long sleeves, pants, a wide-brimmed hat, and sunglasses year round, whenever you are outdoors.  Once a month, do a whole body skin exam, using a mirror to look at the skin on your back. Tell your health care provider of new moles, moles that have irregular borders, moles that are larger than a pencil eraser, or moles that have changed in shape or color.  Stay current with required vaccines (immunizations).  Influenza vaccine. All adults should be immunized every year.  Tetanus, diphtheria, and acellular pertussis (Td, Tdap) vaccine. Pregnant women should  receive 1 dose of Tdap vaccine during each pregnancy. The dose should be obtained regardless of the length of time since the last dose. Immunization is preferred during the 27th-36th week of gestation. An adult who has not previously received Tdap or who does not know her vaccine status should receive 1 dose of Tdap. This initial dose should be followed by tetanus and diphtheria toxoids (Td) booster doses every 10 years. Adults with an unknown or incomplete history of completing a 3-dose immunization series with Td-containing vaccines should begin or complete a primary immunization series including a Tdap dose. Adults should receive a Td booster every 10 years.  Varicella vaccine. An adult without evidence of immunity to varicella should receive 2 doses or a second dose if she has previously received 1 dose. Pregnant females who do not have evidence of immunity should receive the first dose after pregnancy. This first dose should be obtained before leaving the health care facility. The second dose should be obtained 4-8 weeks after the first dose.  Human papillomavirus (HPV) vaccine. Females aged 13-26 years who have not received the vaccine previously should obtain the 3-dose series. The vaccine is not recommended for use in pregnant females. However, pregnancy testing is not needed before receiving a dose. If a female is found to be pregnant after receiving a dose, no treatment is needed. In that case, the remaining doses should be delayed until after the pregnancy. Immunization is recommended for any person with an immunocompromised condition through the age of 48 years if she did not get any or all doses earlier. During the 3-dose series, the second dose should be obtained 4-8 weeks after the first dose. The third dose should be obtained 24 weeks after the first dose and 16 weeks after the second dose.  Zoster vaccine. One dose is recommended for adults aged 67 years or older unless certain conditions are  present.  Measles, mumps, and rubella (MMR) vaccine. Adults born before [birth year] generally are considered immune to measles and mumps. Adults born in [birth year] or later should have 1 or more doses of MMR vaccine unless there is a contraindication to the vaccine or there is laboratory evidence of immunity  to each of the three diseases. A routine second dose of MMR vaccine should be obtained at least 28 days after the first dose for students attending postsecondary schools, health care workers, or international travelers. People who received inactivated measles vaccine or an unknown type of measles vaccine during 1963-1967 should receive 2 doses of MMR vaccine. People who received inactivated mumps vaccine or an unknown type of mumps vaccine before 1979 and are at high risk for mumps infection should consider immunization with 2 doses of MMR vaccine. For females of childbearing age, rubella immunity should be determined. If there is no evidence of immunity, females who are not pregnant should be vaccinated. If there is no evidence of immunity, females who are pregnant should delay immunization until after pregnancy. Unvaccinated health care workers born before [birth year] who lack laboratory evidence of measles, mumps, or rubella immunity or laboratory confirmation of disease should consider measles and mumps immunization with 2 doses of MMR vaccine or rubella immunization with 1 dose of MMR vaccine.  Pneumococcal 13-valent conjugate (PCV13) vaccine. When indicated, a person who is uncertain of her immunization history and has no record of immunization should receive the PCV13 vaccine. An adult aged 58 years or older who has certain medical conditions and has not been previously immunized should receive 1 dose of PCV13 vaccine. This PCV13 should be followed with a dose of pneumococcal polysaccharide (PPSV23) vaccine. The PPSV23 vaccine dose should be obtained at least 8 weeks after the dose of PCV13 vaccine. An adult aged 65  years or older who has certain medical conditions and previously received 1 or more doses of PPSV23 vaccine should receive 1 dose of PCV13. The PCV13 vaccine dose should be obtained 1 or more years after the last PPSV23 vaccine dose.  Pneumococcal polysaccharide (PPSV23) vaccine. When PCV13 is also indicated, PCV13 should be obtained first. All adults aged 18 years and older should be immunized. An adult younger than age 74 years who has certain medical conditions should be immunized. Any person who resides in a nursing home or long-term care facility should be immunized. An adult smoker should be immunized. People with an immunocompromised condition and certain other conditions should receive both PCV13 and PPSV23 vaccines. People with human immunodeficiency virus (HIV) infection should be immunized as soon as possible after diagnosis. Immunization during chemotherapy or radiation therapy should be avoided. Routine use of PPSV23 vaccine is not recommended for American Indians, [place] Natives, or people younger than 65 years unless there are medical conditions that require PPSV23 vaccine. When indicated, people who have unknown immunization and have no record of immunization should receive PPSV23 vaccine. One-time revaccination 5 years after the first dose of PPSV23 is recommended for people aged 19-64 years who have chronic kidney failure, nephrotic syndrome, asplenia, or immunocompromised conditions. People who received 1-2 doses of PPSV23 before age 21 years should receive another dose of PPSV23 vaccine at age 42 years or later if at least 5 years have passed since the previous dose. Doses of PPSV23 are not needed for people immunized with PPSV23 at or after age 61 years.  Meningococcal vaccine. Adults with asplenia or persistent complement component deficiencies should receive 2 doses of quadrivalent meningococcal conjugate (MenACWY-D) vaccine. The doses should be obtained at least 2 months apart.  Microbiologists working with certain meningococcal bacteria, [employer], people at risk during an outbreak, and people who travel to or live in countries with a high rate of meningitis should be immunized. A first-year college student up through  age 23 years who is living in a residence hall should receive a dose if she did not receive a dose on or after her 16th birthday. Adults who have certain high-risk conditions should receive one or more doses of vaccine.  Hepatitis A vaccine. Adults who wish to be protected from this disease, have certain high-risk conditions, work with hepatitis A-infected animals, work in hepatitis A research labs, or travel to or work in countries with a high rate of hepatitis A should be immunized. Adults who were previously unvaccinated and who anticipate close contact with an international adoptee during the first 60 days after arrival in the [place] from a country with a high rate of hepatitis A should be immunized.  Hepatitis B vaccine. Adults who wish to be protected from this disease, have certain high-risk conditions, may be exposed to blood or other infectious body fluids, are household contacts or sex partners of hepatitis B positive people, are clients or workers in certain care facilities, or travel to or work in countries with a high rate of hepatitis B should be immunized.  Haemophilus influenzae type b (Hib) vaccine. A previously unvaccinated person with asplenia or sickle cell disease or having a scheduled splenectomy should receive 1 dose of Hib vaccine. Regardless of previous immunization, a recipient of a hematopoietic stem cell transplant should receive a 3-dose series 6-12 months after her successful transplant. Hib vaccine is not recommended for adults with HIV infection. Preventive Services / Frequency  Ages 65 to 22 years  Blood pressure check.  Lipid and cholesterol check.  Clinical breast exam.** / Every 3 years for women in their 53s  and 35s.  BRCA-related cancer risk assessment.** / For women who have family members with a BRCA-related cancer (breast, ovarian, tubal, or peritoneal cancers).  Pap test.** / Every 2 years from ages 41 through 44. Every 3 years starting at age 64 through age 28 or 21 with a history of 3 consecutive normal Pap tests.  HPV screening.** / Every 3 years from ages 9 through ages 56 to 48 with a history of 3 consecutive normal Pap tests.  Hepatitis C blood test.** / For any individual with known risks for hepatitis C.  Skin self-exam. / Monthly.  Influenza vaccine. / Every year.  Tetanus, diphtheria, and acellular pertussis (Tdap, Td) vaccine.** / Consult your health care provider. Pregnant women should receive 1 dose of Tdap vaccine during each pregnancy. 1 dose of Td every 10 years.  Varicella vaccine.** / Consult your health care provider. Pregnant females who do not have evidence of immunity should receive the first dose after pregnancy.  HPV vaccine. / 3 doses over 6 months, if 67 and younger. The vaccine is not recommended for use in pregnant females. However, pregnancy testing is not needed before receiving a dose.  Measles, mumps, rubella (MMR) vaccine.** / You need at least 1 dose of MMR if you were born in 1957 or later. You may also need a 2nd dose. For females of childbearing age, rubella immunity should be determined. If there is no evidence of immunity, females who are not pregnant should be vaccinated. If there is no evidence of immunity, females who are pregnant should delay immunization until after pregnancy.  Pneumococcal 13-valent conjugate (PCV13) vaccine.** / Consult your health care provider.  Pneumococcal polysaccharide (PPSV23) vaccine.** / 1 to 2 doses if you smoke cigarettes or if you have certain conditions.  Meningococcal vaccine.** / 1 dose if you are age 61 to 21 years and  a Market researcher living in a residence hall, or have one of several medical  conditions, you need to get vaccinated against meningococcal disease. You may also need additional booster doses.  Hepatitis A vaccine.** / Consult your health care provider.  Hepatitis B vaccine.** / Consult your health care provider.  Haemophilus influenzae type b (Hib) vaccine.** / Consult your health care provider.

## 2015-03-24 ENCOUNTER — Other Ambulatory Visit: Payer: Self-pay | Admitting: Internal Medicine

## 2015-03-24 LAB — HEPATIC FUNCTION PANEL
ALK PHOS: 50 U/L (ref 33–115)
ALT: 12 U/L (ref 6–29)
AST: 12 U/L (ref 10–30)
Albumin: 4 g/dL (ref 3.6–5.1)
BILIRUBIN DIRECT: 0.1 mg/dL (ref <=0.2)
BILIRUBIN INDIRECT: 0.3 mg/dL (ref 0.2–1.2)
BILIRUBIN TOTAL: 0.4 mg/dL (ref 0.2–1.2)
Total Protein: 7 g/dL (ref 6.1–8.1)

## 2015-03-24 LAB — LIPID PANEL
CHOL/HDL RATIO: 3.5 ratio (ref <=5.0)
Cholesterol: 209 mg/dL — ABNORMAL HIGH (ref 125–200)
HDL: 59 mg/dL (ref >=46)
LDL Cholesterol: 115 mg/dL (ref <130)
Triglycerides: 174 mg/dL — ABNORMAL HIGH (ref <150)
VLDL: 35 mg/dL — AB (ref <30)

## 2015-03-24 LAB — BASIC METABOLIC PANEL WITH GFR
BUN: 11 mg/dL (ref 7–25)
CHLORIDE: 101 mmol/L (ref 98–110)
CO2: 26 mmol/L (ref 20–31)
Calcium: 9.6 mg/dL (ref 8.6–10.2)
Creat: 0.77 mg/dL (ref 0.50–1.10)
GFR, Est African American: >89 mL/min (ref >=60)
GFR, Est Non African American: >89 mL/min (ref >=60)
Glucose, Bld: 69 mg/dL (ref 65–99)
POTASSIUM: 4.6 mmol/L (ref 3.5–5.3)
SODIUM: 141 mmol/L (ref 135–146)

## 2015-03-24 LAB — VITAMIN D 25 HYDROXY (VIT D DEFICIENCY, FRACTURES): Vit D, 25-Hydroxy: 35 ng/mL (ref 30–100)

## 2015-03-24 LAB — URINALYSIS, ROUTINE W REFLEX MICROSCOPIC
Bilirubin Urine: NEGATIVE
GLUCOSE, UA: NEGATIVE
HGB URINE DIPSTICK: NEGATIVE
KETONES UR: NEGATIVE
LEUKOCYTES UA: NEGATIVE
Nitrite: NEGATIVE
PROTEIN: NEGATIVE
Specific Gravity, Urine: 1.012 (ref 1.001–1.035)
pH: 7.5 (ref 5.0–8.0)

## 2015-03-24 LAB — INSULIN, RANDOM: Insulin: 4.9 u[IU]/mL (ref 2.0–19.6)

## 2015-03-24 LAB — MICROALBUMIN / CREATININE URINE RATIO
Creatinine, Urine: 63 mg/dL (ref 20–320)
Microalb, Ur: <0.2 mg/dL

## 2015-03-24 LAB — IRON AND TIBC
%SAT: 15 % (ref 11–50)
IRON: 69 ug/dL (ref 40–190)
TIBC: 465 ug/dL — AB (ref 250–450)
UIBC: 396 ug/dL (ref 125–400)

## 2015-03-24 LAB — VITAMIN B12: VITAMIN B 12: 324 pg/mL (ref 211–911)

## 2015-03-24 LAB — HEMOGLOBIN A1C
Hgb A1c MFr Bld: 5.9 % — ABNORMAL HIGH (ref <5.7)
Mean Plasma Glucose: 123 mg/dL — ABNORMAL HIGH (ref <117)

## 2015-03-24 LAB — MAGNESIUM: Magnesium: 1.8 mg/dL (ref 1.5–2.5)

## 2015-03-24 LAB — TSH: TSH: 7.699 u[IU]/mL — ABNORMAL HIGH (ref 0.350–4.500)

## 2015-03-24 MED ORDER — LEVOTHYROXINE SODIUM 175 MCG PO TABS: 175.0000 ug | ORAL_TABLET | Freq: Every day | ORAL | Status: DC

## 2015-04-15 ENCOUNTER — Other Ambulatory Visit: Payer: Self-pay | Admitting: Physician Assistant

## 2015-04-16 ENCOUNTER — Other Ambulatory Visit: Payer: Self-pay | Admitting: Physician Assistant

## 2015-04-16 NOTE — Telephone Encounter (Signed)
Called into [facility]

## 2015-04-20 NOTE — Telephone Encounter (Signed)
Rx called into [facility]. 

## 2015-06-09 ENCOUNTER — Other Ambulatory Visit: Payer: Self-pay | Admitting: Physician Assistant

## 2015-06-09 MED ORDER — ALPRAZOLAM 0.5 MG PO TABS: 0.5000 mg | ORAL_TABLET | Freq: Two times a day (BID) | ORAL | Status: DC | PRN

## 2015-06-09 NOTE — Progress Notes (Signed)
Rx called into [facility]

## 2015-06-11 ENCOUNTER — Other Ambulatory Visit: Payer: Self-pay | Admitting: Physician Assistant

## 2015-06-12 ENCOUNTER — Other Ambulatory Visit: Payer: Self-pay

## 2015-06-15 ENCOUNTER — Other Ambulatory Visit: Payer: Self-pay | Admitting: Physician Assistant

## 2015-06-15 MED ORDER — SERTRALINE HCL 100 MG PO TABS: ORAL_TABLET | ORAL | Status: DC

## 2015-06-16 NOTE — Progress Notes (Signed)
Cancel RX @ [facility] & called in new Rx into [facility] pharmacy.

## 2015-07-13 ENCOUNTER — Other Ambulatory Visit: Payer: Self-pay | Admitting: Physician Assistant

## 2015-07-22 ENCOUNTER — Other Ambulatory Visit: Payer: Self-pay | Admitting: Internal Medicine

## 2015-07-22 ENCOUNTER — Other Ambulatory Visit: Payer: Self-pay | Admitting: Physician Assistant

## 2015-07-22 DIAGNOSIS — F411 Generalized anxiety disorder: Secondary | ICD-10-CM

## 2015-07-23 ENCOUNTER — Other Ambulatory Visit: Payer: Self-pay | Admitting: Physician Assistant

## 2015-07-23 DIAGNOSIS — F411 Generalized anxiety disorder: Secondary | ICD-10-CM

## 2015-09-23 ENCOUNTER — Ambulatory Visit: Payer: Self-pay | Admitting: Physician Assistant

## 2015-09-30 ENCOUNTER — Ambulatory Visit: Payer: Self-pay | Admitting: Physician Assistant

## 2015-10-01 ENCOUNTER — Ambulatory Visit (INDEPENDENT_AMBULATORY_CARE_PROVIDER_SITE_OTHER): Payer: 59 | Admitting: Physician Assistant

## 2015-10-01 ENCOUNTER — Encounter: Payer: Self-pay | Admitting: Physician Assistant

## 2015-10-01 VITALS — BP 110/60 | HR 75 | Temp 97.7°F | Resp 14 | Ht 69.0 in | Wt 159.2 lb

## 2015-10-01 DIAGNOSIS — R7303 Prediabetes: Secondary | ICD-10-CM

## 2015-10-01 DIAGNOSIS — R110 Nausea: Secondary | ICD-10-CM | POA: Diagnosis not present

## 2015-10-01 DIAGNOSIS — F329 Major depressive disorder, single episode, unspecified: Secondary | ICD-10-CM | POA: Diagnosis not present

## 2015-10-01 DIAGNOSIS — E785 Hyperlipidemia, unspecified: Secondary | ICD-10-CM | POA: Diagnosis not present

## 2015-10-01 DIAGNOSIS — E039 Hypothyroidism, unspecified: Secondary | ICD-10-CM

## 2015-10-01 DIAGNOSIS — E559 Vitamin D deficiency, unspecified: Secondary | ICD-10-CM | POA: Diagnosis not present

## 2015-10-01 DIAGNOSIS — F32A Depression, unspecified: Secondary | ICD-10-CM

## 2015-10-01 DIAGNOSIS — Z79899 Other long term (current) drug therapy: Secondary | ICD-10-CM | POA: Diagnosis not present

## 2015-10-01 LAB — LIPID PANEL
Cholesterol: 177 mg/dL (ref 125–200)
HDL: 55 mg/dL (ref >=46)
LDL CALC: 107 mg/dL (ref <130)
Total CHOL/HDL Ratio: 3.2 Ratio (ref <=5.0)
Triglycerides: 73 mg/dL (ref <150)
VLDL: 15 mg/dL (ref <30)

## 2015-10-01 LAB — BASIC METABOLIC PANEL WITH GFR
BUN: 9 mg/dL (ref 7–25)
CHLORIDE: 103 mmol/L (ref 98–110)
CO2: 26 mmol/L (ref 20–31)
CREATININE: 0.74 mg/dL (ref 0.50–1.10)
Calcium: 9.5 mg/dL (ref 8.6–10.2)
GFR, Est African American: >89 mL/min (ref >=60)
Glucose, Bld: 84 mg/dL (ref 65–99)
Potassium: 4.9 mmol/L (ref 3.5–5.3)
SODIUM: 141 mmol/L (ref 135–146)

## 2015-10-01 LAB — CBC WITH DIFFERENTIAL/PLATELET
BASOS PCT: 0 %
Basophils Absolute: 0 cells/uL (ref 0–200)
EOS PCT: 3 %
Eosinophils Absolute: 207 cells/uL (ref 15–500)
HCT: 38.5 % (ref 35.0–45.0)
HEMOGLOBIN: 12.7 g/dL (ref 11.7–15.5)
LYMPHS ABS: 2829 {cells}/uL (ref 850–3900)
Lymphocytes Relative: 41 %
MCH: 28.5 pg (ref 27.0–33.0)
MCHC: 33 g/dL (ref 32.0–36.0)
MCV: 86.5 fL (ref 80.0–100.0)
MONOS PCT: 5 %
MPV: 10.5 fL (ref 7.5–12.5)
Monocytes Absolute: 345 cells/uL (ref 200–950)
NEUTROS ABS: 3519 {cells}/uL (ref 1500–7800)
Neutrophils Relative %: 51 %
PLATELETS: 268 10*3/uL (ref 140–400)
RBC: 4.45 MIL/uL (ref 3.80–5.10)
RDW: 13.6 % (ref 11.0–15.0)
WBC: 6.9 10*3/uL (ref 3.8–10.8)

## 2015-10-01 LAB — MAGNESIUM: Magnesium: 1.9 mg/dL (ref 1.5–2.5)

## 2015-10-01 LAB — HEPATIC FUNCTION PANEL
ALT: 12 U/L (ref 6–29)
AST: 12 U/L (ref 10–30)
Albumin: 4.1 g/dL (ref 3.6–5.1)
Alkaline Phosphatase: 59 U/L (ref 33–115)
BILIRUBIN DIRECT: 0.1 mg/dL (ref <=0.2)
BILIRUBIN INDIRECT: 0.4 mg/dL (ref 0.2–1.2)
BILIRUBIN TOTAL: 0.5 mg/dL (ref 0.2–1.2)
Total Protein: 6.9 g/dL (ref 6.1–8.1)

## 2015-10-01 LAB — POCT URINE PREGNANCY: PREG TEST UR: NEGATIVE

## 2015-10-01 LAB — TSH: TSH: 2.61 m[IU]/L

## 2015-10-01 NOTE — Progress Notes (Signed)
Assessment and Plan:   Hypertension -Continue medication, monitor blood pressure at home. Continue DASH diet.  Reminder to go to the ER if any CP, SOB, nausea, dizziness, severe HA, changes vision/speech, left arm numbness and tingling and jaw pain.  Cholesterol -Continue diet and exercise. Check cholesterol.    Prediabetes  -Continue diet and exercise. Check A1C  Vitamin D Def - check level and continue medications.   Nausea Rule out pregnancy, check TSH/labs, get on zantac at night ? May need to adjust BCP  Continue diet and meds as discussed. Further disposition pending results of labs. Over 30 minutes of exam, counseling, chart review, and critical decision making was performed  Future Appointments Date Time Provider Department Center  10/21/2015 3:45 PM Quentin MullingAmanda Tawna Alwin, PA-C [facility] None     HPI 40 y.o. female  presents for 3 month follow up on hypertension, cholesterol, prediabetes, and vitamin D deficiency.   She states the week after her menstrual period she will cry very quickly, has decreased motivation, "feels sad and sleepy", no abnormal periods, not abnormally heavy. She is on BCP, unchanged. Having morning sickness.  Her blood pressure has been controlled at home, today their BP is BP: 110/60 mmHg  She does workout. She denies chest pain, shortness of breath, dizziness.  She is not on cholesterol medication and denies myalgias. Her cholesterol is at goal. The cholesterol last visit was:   Lab Results  Component Value Date   CHOL 209* 03/23/2015   HDL 59 03/23/2015   LDLCALC 115 03/23/2015   TRIG 174* 03/23/2015   CHOLHDL 3.5 03/23/2015    She has been working on diet and exercise for prediabetes, and denies paresthesia of the feet, polydipsia, polyuria and visual disturbances. Last A1C in the office was:  Lab Results  Component Value Date   HGBA1C 5.9* 03/23/2015   Patient is on Vitamin D supplement.   Lab Results  Component Value Date   VD25OH 35  03/23/2015     She is on thyroid medication. Her medication was changed last visit from 150 to 175. She is taking it with coffee in the morning.    Lab Results  Component Value Date   TSH 7.699* 03/23/2015  .   Current Medications:  Current Outpatient Prescriptions on File Prior to Visit  Medication Sig Dispense Refill  . ALPRAZolam (XANAX) 0.5 MG tablet Take 1/2 to 1 tablet 2 x day if needed for Anxiety 60 tablet 2  . levothyroxine (SYNTHROID, LEVOTHROID) 175 MCG tablet Take 1 tablet (175 mcg total) by mouth daily before breakfast. 30 tablet 11  . ORTHO-CYCLEN, 28, 0.25-35 MG-MCG tablet Take 1 tablet by mouth daily.  12  . sertraline (ZOLOFT) 100 MG tablet Take 1/2 to 1 tablet daily. 30 tablet 2   No current facility-administered medications on file prior to visit.   Medical History:  Past Medical History  Diagnosis Date  . Hypothyroid   . Depression   . Anxiety    Allergies: No Known Allergies   Review of Systems:  ROS  Family history- Review and unchanged Social history- Review and unchanged Physical Exam: BP 110/60 mmHg  Pulse 75  Temp(Src) 97.7 F (36.5 C) (Temporal)  Resp 14  Ht 5\' 9"  (1.753 m)  Wt 159 lb 3.2 oz (72.213 kg)  BMI 23.50 kg/m2  SpO2 97%  LMP 09/09/2015 Wt Readings from Last 3 Encounters:  10/01/15 159 lb 3.2 oz (72.213 kg)  03/23/15 165 lb (74.844 kg)  01/26/15 158 lb (71.668 kg)  General Appearance: Well nourished, in no apparent distress. Eyes: PERRLA, EOMs, conjunctiva no swelling or erythema Sinuses: No Frontal/maxillary tenderness ENT/Mouth: Ext aud canals clear, TMs without erythema, bulging. No erythema, swelling, or exudate on post pharynx.  Tonsils not swollen or erythematous. Hearing normal.  Neck: Supple, thyroid normal.  Respiratory: Respiratory effort normal, BS equal bilaterally without rales, rhonchi, wheezing or stridor.  Cardio: RRR with no MRGs. Brisk peripheral pulses without edema.  Abdomen: Soft, + BS,  Non tender, no  guarding, rebound, hernias, masses. Lymphatics: Non tender without lymphadenopathy.  Musculoskeletal: Full ROM, 5/5 strength, Normal gait Skin: Warm, dry without rashes, lesions, ecchymosis.  Neuro: Cranial nerves intact. Normal muscle tone, no cerebellar symptoms. Psych: Awake and oriented X 3, normal affect, Insight and Judgment appropriate.    Quentin MullingAmanda Leza Apsey, PA-C 11:13 AM [facility]

## 2015-10-01 NOTE — Patient Instructions (Signed)
Get on zantac 150-300mg  at night daily  Try to take the thyroid medication 30-1 hour before any food or drink other than water.   Nausea, Adult Nausea is the feeling that you have an upset stomach or have to vomit. Nausea by itself is not likely a serious concern, but it may be an early sign of more serious medical problems. As nausea gets worse, it can lead to vomiting. If vomiting develops, there is the risk of dehydration.  CAUSES   Viral infections.  Food poisoning.  Medicines.  Pregnancy.  Motion sickness.  Migraine headaches.  Emotional distress.  Severe pain from any source.  Alcohol intoxication. HOME CARE INSTRUCTIONS  Get plenty of rest.  Ask your caregiver about specific rehydration instructions.  Eat small amounts of food and sip liquids more often.  Take all medicines as told by your caregiver. SEEK MEDICAL CARE IF:  You have not improved after 2 days, or you get worse.  You have a headache. SEEK IMMEDIATE MEDICAL CARE IF:   You have a fever.  You faint.  You keep vomiting or have blood in your vomit.  You are extremely weak or dehydrated.  You have dark or bloody stools.  You have severe chest or abdominal pain. MAKE SURE YOU:  Understand these instructions.  Will watch your condition.  Will get help right away if you are not doing well or get worse.   This information is not intended to replace advice given to you by your health care provider. Make sure you discuss any questions you have with your health care provider.   Document Released: 04/28/2004 Document Revised: 04/11/2014 Document Reviewed: 12/01/2010 Elsevier Interactive Patient Education [employer]2016 Elsevier Inc.

## 2015-10-02 LAB — VITAMIN D 25 HYDROXY (VIT D DEFICIENCY, FRACTURES): VIT D 25 HYDROXY: 50 ng/mL (ref 30–100)

## 2015-10-02 LAB — HEMOGLOBIN A1C
Hgb A1c MFr Bld: 5.4 %
Mean Plasma Glucose: 108 mg/dL

## 2015-10-08 ENCOUNTER — Telehealth: Payer: Self-pay

## 2015-10-08 NOTE — Telephone Encounter (Signed)
UNABLE TO LVM DUE TO VOICE MAILBOX NOT BEING SET UP.

## 2015-10-13 ENCOUNTER — Encounter: Payer: Self-pay | Admitting: Internal Medicine

## 2015-10-20 ENCOUNTER — Ambulatory Visit: Payer: Self-pay | Admitting: Physician Assistant

## 2015-10-20 ENCOUNTER — Other Ambulatory Visit: Payer: Self-pay | Admitting: Physician Assistant

## 2015-10-20 NOTE — Telephone Encounter (Signed)
RX CALLED INTO [facility]. 

## 2015-10-21 ENCOUNTER — Ambulatory Visit (INDEPENDENT_AMBULATORY_CARE_PROVIDER_SITE_OTHER): Payer: 59 | Admitting: Physician Assistant

## 2015-10-21 ENCOUNTER — Encounter: Payer: Self-pay | Admitting: Physician Assistant

## 2015-10-21 VITALS — BP 120/82 | HR 89 | Temp 97.3°F | Resp 14 | Ht 69.0 in | Wt 160.0 lb

## 2015-10-21 DIAGNOSIS — R110 Nausea: Secondary | ICD-10-CM | POA: Diagnosis not present

## 2015-10-21 DIAGNOSIS — E039 Hypothyroidism, unspecified: Secondary | ICD-10-CM | POA: Diagnosis not present

## 2015-10-21 MED ORDER — LEVONORGESTREL-ETHINYL ESTRAD 0.15-30 MG-MCG PO TABS: 1.0000 | ORAL_TABLET | Freq: Every day | ORAL | Status: DC

## 2015-10-21 NOTE — Patient Instructions (Signed)
Can switch BCP for the first day of next BCP Add on zantac daily

## 2015-10-21 NOTE — Progress Notes (Signed)
Assessment and Plan: ? From tier of BCP- will switch to constant dose of estrogen and see if this helps, if not we may recheck labs and or add on wellbutrin for a short time- patient will call    HPI 40 y.o.female presents for 1 month follow up for nausea and fatigue. For past 3 months at the beginning of her orthotricyclen low she will become emotional, nausea, fatigue x 1 week and then it improves after that. She has not tried the zantac at night, she had normal labs last visit with negative urine pregnancy test.    Past Medical History  Diagnosis Date  . Hypothyroid   . Depression   . Anxiety      No Known Allergies    Current Outpatient Prescriptions on File Prior to Visit  Medication Sig Dispense Refill  . ALPRAZolam (XANAX) 0.5 MG tablet TAKE ONE TABLET BY MOUTH TWICE DAILY AS NEEDED 60 tablet 0  . Cholecalciferol (VITAMIN D3) 5000 units TABS Take 5,000 Units by mouth daily.    Marland Kitchen. levothyroxine (SYNTHROID, LEVOTHROID) 175 MCG tablet Take 1 tablet (175 mcg total) by mouth daily before breakfast. 30 tablet 11  . ORTHO-CYCLEN, 28, 0.25-35 MG-MCG tablet Take 1 tablet by mouth daily.  12  . sertraline (ZOLOFT) 100 MG tablet Take 1/2 to 1 tablet daily. 30 tablet 2   No current facility-administered medications on file prior to visit.    ROS: all negative except above.   Physical Exam: Filed Weights   10/21/15 1552  Weight: 160 lb (72.576 kg)   BP 120/82 mmHg  Pulse 89  Temp(Src) 97.3 F (36.3 C) (Temporal)  Resp 14  Ht 5\' 9"  (1.753 m)  Wt 160 lb (72.576 kg)  BMI 23.62 kg/m2  SpO2 99%  LMP 10/06/2015 General Appearance: Well nourished, in no apparent distress. Eyes: PERRLA, EOMs, conjunctiva no swelling or erythema Sinuses: No Frontal/maxillary tenderness ENT/Mouth: Ext aud canals clear, TMs without erythema, bulging. No erythema, swelling, or exudate on post pharynx.  Tonsils not swollen or erythematous. Hearing normal.  Neck: Supple, thyroid normal.  Respiratory:  Respiratory effort normal, BS equal bilaterally without rales, rhonchi, wheezing or stridor.  Cardio: RRR with no MRGs. Brisk peripheral pulses without edema.  Abdomen: Soft, + BS.  Non tender, no guarding, rebound, hernias, masses. Lymphatics: Non tender without lymphadenopathy.  Musculoskeletal: Full ROM, 5/5 strength, normal gait.  Skin: Warm, dry without rashes, lesions, ecchymosis.  Neuro: Cranial nerves intact. Normal muscle tone, no cerebellar symptoms. Sensation intact.  Psych: Awake and oriented X 3, normal affect, Insight and Judgment appropriate.     Quentin MullingAmanda Collier, PA-C 4:01 PM [facility]

## 2015-11-18 ENCOUNTER — Other Ambulatory Visit: Payer: Self-pay | Admitting: Physician Assistant

## 2015-12-24 ENCOUNTER — Other Ambulatory Visit: Payer: Self-pay | Admitting: Internal Medicine

## 2016-01-22 ENCOUNTER — Other Ambulatory Visit: Payer: Self-pay | Admitting: Physician Assistant

## 2016-01-26 ENCOUNTER — Other Ambulatory Visit: Payer: Self-pay | Admitting: Physician Assistant

## 2016-02-08 ENCOUNTER — Encounter: Payer: Self-pay | Admitting: Physician Assistant

## 2016-04-13 ENCOUNTER — Encounter: Payer: Self-pay | Admitting: Physician Assistant

## 2016-04-13 ENCOUNTER — Ambulatory Visit (INDEPENDENT_AMBULATORY_CARE_PROVIDER_SITE_OTHER): Payer: 59 | Admitting: Physician Assistant

## 2016-04-13 ENCOUNTER — Other Ambulatory Visit: Payer: Self-pay

## 2016-04-13 VITALS — BP 110/70 | HR 76 | Temp 98.1°F | Resp 16 | Ht 69.0 in | Wt 160.0 lb

## 2016-04-13 DIAGNOSIS — F3341 Major depressive disorder, recurrent, in partial remission: Secondary | ICD-10-CM

## 2016-04-13 DIAGNOSIS — M545 Low back pain, unspecified: Secondary | ICD-10-CM

## 2016-04-13 DIAGNOSIS — G8929 Other chronic pain: Secondary | ICD-10-CM

## 2016-04-13 DIAGNOSIS — Z23 Encounter for immunization: Secondary | ICD-10-CM

## 2016-04-13 DIAGNOSIS — E039 Hypothyroidism, unspecified: Secondary | ICD-10-CM

## 2016-04-13 MED ORDER — ALPRAZOLAM 0.5 MG PO TABS
0.5000 mg | ORAL_TABLET | Freq: Two times a day (BID) | ORAL | 0 refills | Status: DC | PRN
Start: 2016-04-13 — End: 2016-08-03

## 2016-04-13 MED ORDER — IBUPROFEN 800 MG PO TABS: 800.0000 mg | ORAL_TABLET | Freq: Three times a day (TID) | ORAL | 1 refills | Status: DC | PRN

## 2016-04-13 NOTE — Addendum Note (Signed)
Addended by: Rodney BoozeUFF, Sharon D on: 04/13/2016 04:12 PM   Modules accepted: Orders

## 2016-04-13 NOTE — Progress Notes (Signed)
Xanax was called into pharmacy 

## 2016-04-13 NOTE — Patient Instructions (Signed)

## 2016-04-13 NOTE — Progress Notes (Signed)
Assessment and Plan:   Hypertension -Continue medication, monitor blood pressure at home. Continue DASH diet.  Reminder to go to the ER if any CP, SOB, nausea, dizziness, severe HA, changes vision/speech, left arm numbness and tingling and jaw pain.  Cholesterol -Continue diet and exercise. Check cholesterol.    Prediabetes  -Continue diet and exercise. Check A1C  Vitamin D Def - check level and continue medications.   Midline back pain No red flag symptoms, since mid line will get Xray, ibuprofen, suggest PT  Continue diet and meds as discussed. Further disposition pending results of labs. Over 30 minutes of exam, counseling, chart review, and critical decision making was performed  No future appointments.   HPI 41 y.o. female  presents for 3 month follow up on hypertension, cholesterol, prediabetes, and vitamin D deficiency.    Her blood pressure has been controlled at home, today their BP is BP: 110/70  She does workout. She denies chest pain, shortness of breath, dizziness. BCP was switched and she is doing well with new medication.  She complains of lower back pain, mid line back pain, getting worse, achy and will pop, no injury, no pain in her legs, no radiation, no weakness, no incontinence. Took husbands 800mg  ibuprofen felt better but continues to pop. Sits all day.   She is not on cholesterol medication and denies myalgias. Her cholesterol is at goal. The cholesterol last visit was:   Lab Results  Component Value Date   CHOL 177 10/01/2015   HDL 55 10/01/2015   LDLCALC 107 10/01/2015   TRIG 73 10/01/2015   CHOLHDL 3.2 10/01/2015    She has been working on diet and exercise for prediabetes, and denies paresthesia of the feet, polydipsia, polyuria and visual disturbances. Last A1C in the office was:  Lab Results  Component Value Date   HGBA1C 5.4 10/01/2015   Patient is on Vitamin D supplement.   Lab Results  Component Value Date   VD25OH 7350 10/01/2015     She  is on thyroid medication. Her medication was not changed, she is on 175 daily.  She is taking it with coffee in the morning.    Lab Results  Component Value Date   TSH 2.61 10/01/2015   BMI is Body mass index is 23.63 kg/m. Wt Readings from Last 3 Encounters:  04/13/16 160 lb (72.6 kg)  10/21/15 160 lb (72.6 kg)  10/01/15 159 lb 3.2 oz (72.2 kg)    Current Medications:  Current Outpatient Prescriptions on File Prior to Visit  Medication Sig Dispense Refill  . ALPRAZolam (XANAX) 0.5 MG tablet TAKE ONE TABLET BY MOUTH TWICE DAILY AS NEEDED 180 tablet 0  . Cholecalciferol (VITAMIN D3) 5000 units TABS Take 5,000 Units by mouth daily.    Marland Kitchen. levonorgestrel-ethinyl estradiol (NORDETTE) 0.15-30 MG-MCG tablet Take 1 tablet by mouth daily. 3 Package 3  . sertraline (ZOLOFT) 100 MG tablet Take 1/2 to 1 tablet daily. 30 tablet 2  . levothyroxine (SYNTHROID, LEVOTHROID) 175 MCG tablet Take 1 tablet (175 mcg total) by mouth daily before breakfast. 30 tablet 11   No current facility-administered medications on file prior to visit.    Medical History:  Past Medical History:  Diagnosis Date  . Anxiety   . Depression   . Hypothyroid    Allergies: No Known Allergies   Review of Systems:  Review of Systems  Constitutional: Negative.   HENT: Negative.   Eyes: Negative.   Respiratory: Negative.   Cardiovascular: Negative.  Gastrointestinal: Negative.   Genitourinary: Negative.   Musculoskeletal: Positive for back pain.  Skin: Negative.   Neurological: Negative.   Endo/Heme/Allergies: Negative.   Psychiatric/Behavioral: Negative.     Family history- Review and unchanged Social history- Review and unchanged Physical Exam: BP 110/70   Pulse 76   Temp 98.1 F (36.7 C)   Resp 16   Ht 5\' 9"  (1.753 m)   Wt 160 lb (72.6 kg)   LMP 03/30/2016   SpO2 98%   BMI 23.63 kg/m  Wt Readings from Last 3 Encounters:  04/13/16 160 lb (72.6 kg)  10/21/15 160 lb (72.6 kg)  10/01/15 159 lb 3.2 oz  (72.2 kg)   General Appearance: Well nourished, in no apparent distress. Eyes: PERRLA, EOMs, conjunctiva no swelling or erythema Sinuses: No Frontal/maxillary tenderness ENT/Mouth: Ext aud canals clear, TMs without erythema, bulging. No erythema, swelling, or exudate on post pharynx.  Tonsils not swollen or erythematous. Hearing normal.  Neck: Supple, thyroid normal.  Respiratory: Respiratory effort normal, BS equal bilaterally without rales, rhonchi, wheezing or stridor.  Cardio: RRR with no MRGs. Brisk peripheral pulses without edema.  Abdomen: Soft, + BS,  Non tender, no guarding, rebound, hernias, masses. Lymphatics: Non tender without lymphadenopathy.  Musculoskeletal: Full ROM, 5/5 strength, Normal gait, Negative straight leg, no tenderness to palpation, + palpable popping with spine rotation, full rom spine, normal distal neurovascular exam bilateral legs, good strength.  Skin: Warm, dry without rashes, lesions, ecchymosis.  Neuro: Cranial nerves intact. Normal muscle tone, no cerebellar symptoms. Psych: Awake and oriented X 3, normal affect, Insight and Judgment appropriate.    Quentin Mulling, PA-C 3:41 PM [facility]

## 2016-04-14 ENCOUNTER — Other Ambulatory Visit: Payer: Self-pay | Admitting: Physician Assistant

## 2016-04-14 DIAGNOSIS — E039 Hypothyroidism, unspecified: Secondary | ICD-10-CM

## 2016-04-14 LAB — TSH: TSH: 6.12 mIU/L — ABNORMAL HIGH

## 2016-04-27 ENCOUNTER — Encounter: Payer: Self-pay | Admitting: *Deleted

## 2016-05-12 ENCOUNTER — Other Ambulatory Visit: Payer: Self-pay | Admitting: Internal Medicine

## 2016-05-25 ENCOUNTER — Encounter: Payer: Self-pay | Admitting: *Deleted

## 2016-06-15 ENCOUNTER — Encounter: Payer: Self-pay | Admitting: *Deleted

## 2016-08-03 ENCOUNTER — Other Ambulatory Visit: Payer: Self-pay | Admitting: Internal Medicine

## 2016-08-03 ENCOUNTER — Other Ambulatory Visit: Payer: Self-pay | Admitting: Physician Assistant

## 2016-08-03 DIAGNOSIS — F3341 Major depressive disorder, recurrent, in partial remission: Secondary | ICD-10-CM

## 2016-08-03 MED ORDER — ALPRAZOLAM 0.5 MG PO TABS: 0.5000 mg | ORAL_TABLET | Freq: Two times a day (BID) | ORAL | 0 refills | Status: DC | PRN

## 2016-08-03 NOTE — Progress Notes (Signed)
Message sent to front office to schedule an office visit.

## 2016-08-04 ENCOUNTER — Other Ambulatory Visit: Payer: Self-pay | Admitting: Internal Medicine

## 2016-08-04 DIAGNOSIS — F3341 Major depressive disorder, recurrent, in partial remission: Secondary | ICD-10-CM

## 2016-08-05 ENCOUNTER — Other Ambulatory Visit: Payer: Self-pay | Admitting: Internal Medicine

## 2016-08-05 DIAGNOSIS — F3341 Major depressive disorder, recurrent, in partial remission: Secondary | ICD-10-CM

## 2016-08-05 NOTE — Telephone Encounter (Signed)
I called pharmacy & they stated that the last time it was filled was for 180 so I called in the 180 w/o refills from my understanding this was fine just wanted to inform you of what I did.

## 2016-08-05 NOTE — Telephone Encounter (Signed)
Xanax was called into pharmacy on 4th May 2018 @ 9:30 am by DD

## 2016-10-14 ENCOUNTER — Other Ambulatory Visit: Payer: Self-pay | Admitting: Physician Assistant

## 2016-10-25 NOTE — Progress Notes (Signed)
Complete Physical  Assessment and Plan:  Routine general medical examination at a health care facility Follow up GYN  Hypothyroidism, unspecified type Hypothyroidism-check TSH level, continue medications the same, reminded to take on an empty stomach 30-38mns before food.  -     TSH  Recurrent major depressive disorder, in partial remission (HCC) -     sertraline (ZOLOFT) 100 MG tablet; Take 1/2 to 1 tablet daily.  Anxiety Discussed decreasing xanax use, will not refill xanax at this time and will decrease amount given Mainly using as sleep, discussed good for short term but NOT long term use, hand out given, will try melatonin and sleep hygiene at night, if not better can try trazodone -     sertraline (ZOLOFT) 100 MG tablet; Take 1/2 to 1 tablet daily.  Vitamin D deficiency Continue supplement  Medication management -     CBC with Differential/Platelet -     BASIC METABOLIC PANEL WITH GFR -     Hepatic function panel -     Magnesium  Screening cholesterol level -     Lipid panel  Screening for hematuria or proteinuria -     Urinalysis, Routine w reflex microscopic -     Microalbumin / creatinine urine ratio  Screening, anemia, deficiency, iron -     Iron and TIBC -     Vitamin B12  Discussed med's effects and SE's. Screening labs and tests as requested with regular follow-up as recommended. Over 40 minutes of exam, counseling, chart review, and complex, high level critical decision making was performed this visit.   HPI  41y.o. female  presents for a complete physical and follow up for has Hypothyroid; Depression; Anxiety; SVD (spontaneous vaginal delivery); Vitamin D deficiency; and Medication management on her problem list..  Her blood pressure has been controlled at home, today their BP is BP: 118/74 She does not workout. She denies chest pain, shortness of breath, dizziness.  States she is doing better with monophasic CP, has follow up GYN next month.  She will  take xanax occ during the day but normally a half or whole nightly, has never tried other sleep aids, is on zoloft.  She is not on cholesterol medication and denies myalgias. Her cholesterol is at goal. The cholesterol last visit was:   Lab Results  Component Value Date   CHOL 177 10/01/2015   HDL 55 10/01/2015   LDLCALC 107 10/01/2015   TRIG 73 10/01/2015   CHOLHDL 3.2 10/01/2015   . Last A1C in the office was:  Lab Results  Component Value Date   HGBA1C 5.4 10/01/2015   Patient is on Vitamin D supplement.   Lab Results  Component Value Date   VD25OH 55006/29/2017     She is on thyroid medication, taking it in the AM, waiting an hour. Her medication was changed last visit. She is on 1 daily and did not increase it but trying to be better with when she takes it.  Lab Results  Component Value Date   TSH 6.12 (H) 04/13/2016  .  BMI is Body mass index is 22.86 kg/m., she is working on diet and exercise. Wt Readings from Last 3 Encounters:  10/26/16 154 lb 12.8 oz (70.2 kg)  04/13/16 160 lb (72.6 kg)  10/21/15 160 lb (72.6 kg)    Current Medications:  Current Outpatient Prescriptions on File Prior to Visit  Medication Sig Dispense Refill  . ALPRAZolam (XANAX) 0.5 MG tablet TAKE ONE TABLET BY MOUTH  TWICE DAILY AS NEEDED 180 tablet 0  . Cholecalciferol (VITAMIN D3) 5000 units TABS Take 5,000 Units by mouth daily.    Marland Kitchen ibuprofen (ADVIL,MOTRIN) 800 MG tablet Take 1 tablet (800 mg total) by mouth every 8 (eight) hours as needed. 60 tablet 1  . KURVELO 0.15-30 MG-MCG tablet TAKE ONE TABLET BY MOUTH ONCE DAILY 84 tablet 0  . levothyroxine (SYNTHROID, LEVOTHROID) 175 MCG tablet TAKE ONE TABLET BY MOUTH ONCE DAILY BEFORE BREAKFAST 90 tablet 3  . sertraline (ZOLOFT) 100 MG tablet Take 1/2 to 1 tablet daily. 30 tablet 2   No current facility-administered medications on file prior to visit.    Allergies:  No Known Allergies   Medical History:  She has Hypothyroid; Depression;  Anxiety; SVD (spontaneous vaginal delivery); Vitamin D deficiency; and Medication management on her problem list.   Health Maintenance:   Immunization History  Administered Date(s) Administered  . Influenza, Seasonal, Injecte, Preservative Fre 04/13/2016  . MMR 05/14/2014   Tetanus: 2-3 year ago Pneumovax: Prevnar 13:  Flu vaccine: 2017 Zostavax:  Patient's last menstrual period was 10/12/2016. Pap: 2016, has OV this month MGM: 2015 CAT D  DEXA: Colonoscopy: EGD:  Patient Care Team: Unk Pinto, MD as PCP - General (Internal Medicine) Dian Queen, MD as Consulting Physician (Obstetrics and Gynecology)  Surgical History:  She has a past surgical history that includes Heart chamber revision (1979). Family History:  Herfamily history includes Hyperlipidemia in her father and mother. Social History:  She reports that she quit smoking about 3 years ago. She has never used smokeless tobacco. She reports that she drinks alcohol. She reports that she does not use drugs.  Review of Systems: Review of Systems  Constitutional: Negative.   HENT: Negative.   Eyes: Negative.   Respiratory: Negative.   Cardiovascular: Negative.   Gastrointestinal: Negative.   Genitourinary: Negative.   Musculoskeletal: Positive for back pain.  Skin: Negative.   Neurological: Negative.   Endo/Heme/Allergies: Negative.   Psychiatric/Behavioral: Negative.     Physical Exam: Estimated body mass index is 22.86 kg/m as calculated from the following:   Height as of this encounter: _0  (1.753 m).   Weight as of this encounter: 154 lb 12.8 oz (70.2 kg). BP 118/74   Pulse 86   Temp (!) 97.5 F (36.4 C)   Resp 16   Ht _1  (1.753 m)   Wt 154 lb 12.8 oz (70.2 kg)   LMP 10/12/2016   SpO2 98%   BMI 22.86 kg/m  General Appearance: Well nourished, in no apparent distress.  Eyes: PERRLA, EOMs, conjunctiva no swelling or erythema, normal fundi and vessels.  Sinuses: No Frontal/maxillary  tenderness  ENT/Mouth: Ext aud canals clear, normal light reflex with TMs without erythema, bulging. Good dentition. No erythema, swelling, or exudate on post pharynx. Tonsils not swollen or erythematous. Hearing normal.  Neck: Supple, thyroid normal. No bruits  Respiratory: Respiratory effort normal, BS equal bilaterally without rales, rhonchi, wheezing or stridor.  Cardio: RRR without murmurs, rubs or gallops. Brisk peripheral pulses without edema.  Chest: symmetric, with normal excursions and percussion.  Breasts: defer Abdomen: Soft, nontender, no guarding, rebound, hernias, masses, or organomegaly.  Lymphatics: Non tender without lymphadenopathy.  Genitourinary: defer Musculoskeletal: Full ROM all peripheral extremities,5/5 strength, and normal gait. Negative straight leg, no TPP on lower back, good distal neurovascular.   Skin: Warm, dry without rashes, lesions, ecchymosis. Neuro: Cranial nerves intact, reflexes equal bilaterally. Normal muscle tone, no cerebellar symptoms. Sensation intact.  Psych:  Awake and oriented X 3, normal affect, Insight and Judgment appropriate.   EKG: defer AORTA SCAN: defer  Vicie Mutters 2:14 PM [facility]

## 2016-10-26 ENCOUNTER — Ambulatory Visit (INDEPENDENT_AMBULATORY_CARE_PROVIDER_SITE_OTHER): Payer: 59 | Admitting: Physician Assistant

## 2016-10-26 ENCOUNTER — Encounter: Payer: Self-pay | Admitting: Physician Assistant

## 2016-10-26 VITALS — BP 118/74 | HR 86 | Temp 97.5°F | Resp 16 | Ht 69.0 in | Wt 154.8 lb

## 2016-10-26 DIAGNOSIS — E039 Hypothyroidism, unspecified: Secondary | ICD-10-CM

## 2016-10-26 DIAGNOSIS — F419 Anxiety disorder, unspecified: Secondary | ICD-10-CM | POA: Diagnosis not present

## 2016-10-26 DIAGNOSIS — Z79899 Other long term (current) drug therapy: Secondary | ICD-10-CM

## 2016-10-26 DIAGNOSIS — F3341 Major depressive disorder, recurrent, in partial remission: Secondary | ICD-10-CM

## 2016-10-26 DIAGNOSIS — Z1389 Encounter for screening for other disorder: Secondary | ICD-10-CM

## 2016-10-26 DIAGNOSIS — Z13220 Encounter for screening for lipoid disorders: Secondary | ICD-10-CM

## 2016-10-26 DIAGNOSIS — Z130 Encounter for screening for diseases of the blood and blood-forming organs and certain disorders involving the immune mechanism: Secondary | ICD-10-CM

## 2016-10-26 DIAGNOSIS — E559 Vitamin D deficiency, unspecified: Secondary | ICD-10-CM

## 2016-10-26 DIAGNOSIS — Z0000 Encounter for general adult medical examination without abnormal findings: Secondary | ICD-10-CM | POA: Diagnosis not present

## 2016-10-26 LAB — HEPATIC FUNCTION PANEL
ALBUMIN: 4.2 g/dL (ref 3.6–5.1)
ALK PHOS: 56 U/L (ref 33–115)
ALT: 15 U/L (ref 6–29)
AST: 11 U/L (ref 10–30)
BILIRUBIN TOTAL: 0.4 mg/dL (ref 0.2–1.2)
Bilirubin, Direct: 0.1 mg/dL (ref <=0.2)
Indirect Bilirubin: 0.3 mg/dL (ref 0.2–1.2)
TOTAL PROTEIN: 6.7 g/dL (ref 6.1–8.1)

## 2016-10-26 LAB — CBC WITH DIFFERENTIAL/PLATELET
BASOS PCT: 0 %
Basophils Absolute: 0 cells/uL (ref 0–200)
EOS ABS: 350 {cells}/uL (ref 15–500)
Eosinophils Relative: 5 %
HCT: 39.1 % (ref 35.0–45.0)
Hemoglobin: 12.8 g/dL (ref 11.7–15.5)
LYMPHS PCT: 35 %
Lymphs Abs: 2450 cells/uL (ref 850–3900)
MCH: 29 pg (ref 27.0–33.0)
MCHC: 32.7 g/dL (ref 32.0–36.0)
MCV: 88.7 fL (ref 80.0–100.0)
MONOS PCT: 8 %
MPV: 10.6 fL (ref 7.5–12.5)
Monocytes Absolute: 560 cells/uL (ref 200–950)
NEUTROS PCT: 52 %
Neutro Abs: 3640 cells/uL (ref 1500–7800)
Platelets: 269 10*3/uL (ref 140–400)
RBC: 4.41 MIL/uL (ref 3.80–5.10)
RDW: 13.5 % (ref 11.0–15.0)
WBC: 7 10*3/uL (ref 3.8–10.8)

## 2016-10-26 LAB — BASIC METABOLIC PANEL WITH GFR
BUN: 9 mg/dL (ref 7–25)
CALCIUM: 9 mg/dL (ref 8.6–10.2)
CO2: 23 mmol/L (ref 20–31)
CREATININE: 0.74 mg/dL (ref 0.50–1.10)
Chloride: 104 mmol/L (ref 98–110)
GFR, Est Non African American: >89 mL/min (ref >=60)
GLUCOSE: 79 mg/dL (ref 65–99)
POTASSIUM: 4.3 mmol/L (ref 3.5–5.3)
Sodium: 138 mmol/L (ref 135–146)

## 2016-10-26 LAB — IRON AND TIBC
%SAT: 20 % (ref 11–50)
IRON: 78 ug/dL (ref 40–190)
TIBC: 398 ug/dL (ref 250–450)
UIBC: 320 ug/dL

## 2016-10-26 LAB — LIPID PANEL
CHOLESTEROL: 193 mg/dL (ref <200)
HDL: 42 mg/dL — AB (ref >50)
LDL Cholesterol: 125 mg/dL — ABNORMAL HIGH (ref <100)
TRIGLYCERIDES: 131 mg/dL (ref <150)
Total CHOL/HDL Ratio: 4.6 Ratio (ref <5.0)
VLDL: 26 mg/dL (ref <30)

## 2016-10-26 LAB — TSH: TSH: 0.91 m[IU]/L

## 2016-10-26 MED ORDER — SERTRALINE HCL 100 MG PO TABS: ORAL_TABLET | ORAL | 3 refills | Status: DC

## 2016-10-26 NOTE — Patient Instructions (Addendum)
New guidelines suggest the benzodiazepines are best short term, with prolonged use they lead to physical and psychological dependence. In addition, evidence suggest that for insomnia the effectiveness wanes in 4 weeks and the risks out weight their benefits. Use of these agents have been associated with dementia, falls, motor vehicle accidents and physical addiction. Decreasing these medication have been proven to show improvements in cognition, alertness, decrease of falls and daytime sedation.   We will start a slow taper, symptoms of withdrawal include, insomnia, anxiety, irritability, sweating and stomach or intestinal symptoms like diarrhea or nausea.   Try the melatonin 5mg -20mg  dissolvable or gummy 30 mins before bed Can call in something else if this is not helping Need to taper down on the xanax use  11 Tips to Follow:  1. No caffeine after 3pm: Avoid beverages with caffeine (soda, tea, energy drinks, etc.) especially after 3pm. 2. Don't go to bed hungry: Have your evening meal at least 3 hrs. before going to sleep. It's fine to have a small bedtime snack such as a glass of milk and a few crackers but don't have a big meal. 3. Have a nightly routine before bed: Plan on "winding down" before you go to sleep. Begin relaxing about 1 hour before you go to bed. Try doing a quiet activity such as listening to calming music, reading a book or meditating. 4. Turn off the TV and ALL electronics including video games, tablets, laptops, etc. 1 hour before sleep, and keep them out of the bedroom. 5. Turn off your cell phone and all notifications (new email and text alerts) or even better, leave your phone outside your room while you sleep. Studies have shown that a part of your brain continues to respond to certain lights and sounds even while you're still asleep. 6. Make your bedroom quiet, dark and cool. If you can't control the noise, try wearing earplugs or using a fan to block out other  sounds. 7. Practice relaxation techniques. Try reading a book or meditating or drain your brain by writing a list of what you need to do the next day. 8. Don't nap unless you feel sick: you'll have a better night's sleep. 9. Don't smoke, or quit if you do. Nicotine, alcohol, and marijuana can all keep you awake. Talk to your health care provider if you need help with substance use. 10. Most importantly, wake up at the same time every day (or within 1 hour of your usual wake up time) EVEN on the weekends. A regular wake up time promotes sleep hygiene and prevents sleep problems. 11. Reduce exposure to bright light in the last three hours of the day before going to sleep. Maintaining good sleep hygiene and having good sleep habits lower your risk of developing sleep problems. Getting better sleep can also improve your concentration and alertness. Try the simple steps in this guide. If you still have trouble getting enough rest, make an appointment with your health care provider.

## 2016-10-27 LAB — URINALYSIS, ROUTINE W REFLEX MICROSCOPIC
Bilirubin Urine: NEGATIVE
GLUCOSE, UA: NEGATIVE
HGB URINE DIPSTICK: NEGATIVE
Ketones, ur: NEGATIVE
LEUKOCYTES UA: NEGATIVE
NITRITE: NEGATIVE
PH: 7 (ref 5.0–8.0)
Protein, ur: NEGATIVE
SPECIFIC GRAVITY, URINE: 1.01 (ref 1.001–1.035)

## 2016-10-27 LAB — VITAMIN B12: Vitamin B-12: 360 pg/mL (ref 200–1100)

## 2016-10-27 LAB — MICROALBUMIN / CREATININE URINE RATIO
Creatinine, Urine: 52 mg/dL (ref 20–320)
MICROALB UR: 0.3 mg/dL
MICROALB/CREAT RATIO: 6 ug/mg{creat} (ref <30)

## 2016-10-27 LAB — MAGNESIUM: Magnesium: 1.9 mg/dL (ref 1.5–2.5)

## 2016-11-30 ENCOUNTER — Other Ambulatory Visit: Payer: Self-pay | Admitting: Physician Assistant

## 2016-11-30 DIAGNOSIS — F3341 Major depressive disorder, recurrent, in partial remission: Secondary | ICD-10-CM

## 2016-12-01 NOTE — Telephone Encounter (Signed)
Xanax called into pharmacy on 30th Aug 2018 by DD

## 2016-12-25 ENCOUNTER — Other Ambulatory Visit: Payer: Self-pay | Admitting: Internal Medicine

## 2017-01-04 ENCOUNTER — Other Ambulatory Visit: Payer: Self-pay | Admitting: Physician Assistant

## 2017-01-04 DIAGNOSIS — F3341 Major depressive disorder, recurrent, in partial remission: Secondary | ICD-10-CM

## 2017-01-04 NOTE — Telephone Encounter (Signed)
XANAX HAS BEEN CALLED INTO PHARMACY ON Jan 04 2017 BY DD

## 2017-01-05 NOTE — Telephone Encounter (Signed)
XANAX HAS BEEN CALLED INTO PHARMACY ON 10.4 18 BY DD

## 2017-05-01 ENCOUNTER — Ambulatory Visit: Payer: Self-pay | Admitting: Physician Assistant

## 2017-05-12 ENCOUNTER — Other Ambulatory Visit: Payer: Self-pay | Admitting: Physician Assistant

## 2017-05-12 DIAGNOSIS — F3341 Major depressive disorder, recurrent, in partial remission: Secondary | ICD-10-CM

## 2017-05-18 ENCOUNTER — Other Ambulatory Visit: Payer: Self-pay | Admitting: Internal Medicine

## 2017-05-23 ENCOUNTER — Ambulatory Visit: Payer: Self-pay | Admitting: Physician Assistant

## 2017-05-30 NOTE — Progress Notes (Signed)
Assessment and Plan:   Hypertension -Continue medication, monitor blood pressure at home. Continue DASH diet.  Reminder to go to the ER if any CP, SOB, nausea, dizziness, severe HA, changes vision/speech, left arm numbness and tingling and jaw pain.  Hypothryoidism Hypothyroidism-check TSH level, continue medications the same, reminded to take on an empty stomach 30-97mins before food.    Vitamin D Def - check level and continue medications.   Lower back pain Popping/pain midline back, no sciatica Will get Xray, may need PT/ortho  Continue diet and meds as discussed. Further disposition pending results of labs. Over 30 minutes of exam, counseling, chart review, and critical decision making was performed  Future Appointments  Date Time Provider Department Center  10/26/2017  2:00 PM Quentin Mulling, PA-C [id number] None     HPI 42 y.o. female  presents for 3 month follow up on hypertension, cholesterol, prediabetes, and vitamin D deficiency.    She is on zoloft daily and taking xanax 1-2 x a week for sleep.  Her blood pressure has been controlled at home, today their BP is BP: 124/80  She does workout. She denies chest pain, shortness of breath, dizziness.  She is not on cholesterol medication and denies myalgias. Her cholesterol is at goal. The cholesterol last visit was:   Lab Results  Component Value Date   CHOL 193 10/26/2016   HDL 42 (L) 10/26/2016   LDLCALC 125 (H) 10/26/2016   TRIG 131 10/26/2016   CHOLHDL 4.6 10/26/2016    She has been working on diet and exercise for prediabetes, and denies paresthesia of the feet, polydipsia, polyuria and visual disturbances. Last A1C in the office was:  Lab Results  Component Value Date   HGBA1C 5.4 10/01/2015   Patient is on Vitamin D supplement.   Lab Results  Component Value Date   VD25OH 49 10/01/2015     She is on thyroid medication. Her medication was not changed, she is on 175 daily.  She is taking it with coffee in the  morning.    Lab Results  Component Value Date   TSH 0.91 10/26/2016   BMI is Body mass index is 22.45 kg/m. Wt Readings from Last 3 Encounters:  05/31/17 152 lb (68.9 kg)  10/26/16 154 lb 12.8 oz (70.2 kg)  04/13/16 160 lb (72.6 kg)    Current Medications:  Current Outpatient Medications on File Prior to Visit  Medication Sig Dispense Refill  . ALPRAZolam (XANAX) 0.5 MG tablet Take 1/2 to 1 tablet  1 to 2 x/day ONLY if needed for Anxiety Attack and please try to limit to 5 days /week to avoid addiction 60 tablet 0  . Cholecalciferol (VITAMIN D3) 5000 units TABS Take 5,000 Units by mouth daily.    Marland Kitchen ibuprofen (ADVIL,MOTRIN) 800 MG tablet Take 1 tablet (800 mg total) by mouth every 8 (eight) hours as needed. 60 tablet 1  . KURVELO 0.15-30 MG-MCG tablet TAKE 1 TABLET BY MOUTH ONCE DAILY 84 tablet 3  . levothyroxine (SYNTHROID, LEVOTHROID) 175 MCG tablet TAKE 1 TABLET BY MOUTH ONCE DAILY BEFORE  BREAKFAST 90 tablet 0  . sertraline (ZOLOFT) 100 MG tablet Take 1/2 to 1 tablet daily. 90 tablet 3   No current facility-administered medications on file prior to visit.    Medical History:  Past Medical History:  Diagnosis Date  . Anxiety   . Depression   . Hypothyroid    Allergies: No Known Allergies   Review of Systems:  Review of Systems  Constitutional: Negative.   HENT: Negative.   Eyes: Negative.   Respiratory: Negative.   Cardiovascular: Negative.   Gastrointestinal: Negative.   Genitourinary: Negative.   Musculoskeletal: Positive for back pain.  Skin: Negative.   Neurological: Negative.   Endo/Heme/Allergies: Negative.   Psychiatric/Behavioral: Negative.     Family history- Review and unchanged Social history- Review and unchanged Physical Exam: BP 124/80   Pulse 73   Temp 97.7 F (36.5 C)   Ht 5\' 9"  (1.753 m)   Wt 152 lb (68.9 kg)   SpO2 98%   BMI 22.45 kg/m  Wt Readings from Last 3 Encounters:  05/31/17 152 lb (68.9 kg)  10/26/16 154 lb 12.8 oz (70.2 kg)   04/13/16 160 lb (72.6 kg)   General Appearance: Well nourished, in no apparent distress. Eyes: PERRLA, EOMs, conjunctiva no swelling or erythema Sinuses: No Frontal/maxillary tenderness ENT/Mouth: Ext aud canals clear, TMs without erythema, bulging. No erythema, swelling, or exudate on post pharynx.  Tonsils not swollen or erythematous. Hearing normal.  Neck: Supple, thyroid normal.  Respiratory: Respiratory effort normal, BS equal bilaterally without rales, rhonchi, wheezing or stridor.  Cardio: RRR with no MRGs. Brisk peripheral pulses without edema.  Abdomen: Soft, + BS,  Non tender, no guarding, rebound, hernias, masses. Lymphatics: Non tender without lymphadenopathy.  Musculoskeletal: Full ROM, 5/5 strength, Normal gait, Negative straight leg, no tenderness to palpation, + palpable popping with spinal rotation, full rom spine, normal distal neurovascular exam bilateral legs, good strength.  Skin: Warm, dry without rashes, lesions, ecchymosis.  Neuro: Cranial nerves intact. Normal muscle tone, no cerebellar symptoms. Psych: Awake and oriented X 3, normal affect, Insight and Judgment appropriate.    Quentin MullingAmanda Collier, PA-C 11:09 AM [facility]

## 2017-05-31 ENCOUNTER — Encounter: Payer: Self-pay | Admitting: Physician Assistant

## 2017-05-31 ENCOUNTER — Ambulatory Visit (HOSPITAL_COMMUNITY)
Admission: RE | Admit: 2017-05-31 | Discharge: 2017-05-31 | Disposition: A | Discharge status: Home / Self Care | Payer: 59 | Source: Ambulatory Visit | Attending: Physician Assistant | Admitting: Physician Assistant

## 2017-05-31 ENCOUNTER — Ambulatory Visit (INDEPENDENT_AMBULATORY_CARE_PROVIDER_SITE_OTHER): Payer: 59 | Admitting: Physician Assistant

## 2017-05-31 VITALS — BP 124/80 | HR 73 | Temp 97.7°F | Ht 69.0 in | Wt 152.0 lb

## 2017-05-31 DIAGNOSIS — E039 Hypothyroidism, unspecified: Secondary | ICD-10-CM

## 2017-05-31 DIAGNOSIS — F3341 Major depressive disorder, recurrent, in partial remission: Secondary | ICD-10-CM

## 2017-05-31 DIAGNOSIS — M545 Low back pain, unspecified: Secondary | ICD-10-CM

## 2017-05-31 DIAGNOSIS — Z79899 Other long term (current) drug therapy: Secondary | ICD-10-CM | POA: Diagnosis not present

## 2017-05-31 DIAGNOSIS — G8929 Other chronic pain: Secondary | ICD-10-CM

## 2017-05-31 DIAGNOSIS — M5136 Other intervertebral disc degeneration, lumbar region: Secondary | ICD-10-CM | POA: Insufficient documentation

## 2017-05-31 DIAGNOSIS — E559 Vitamin D deficiency, unspecified: Secondary | ICD-10-CM | POA: Diagnosis not present

## 2017-05-31 LAB — HEPATIC FUNCTION PANEL
AG RATIO: 1.6 (calc) (ref 1.0–2.5)
ALBUMIN MSPROF: 4.5 g/dL (ref 3.6–5.1)
ALT: 17 U/L (ref 6–29)
AST: 14 U/L (ref 10–30)
Alkaline phosphatase (APISO): 62 U/L (ref 33–115)
BILIRUBIN INDIRECT: 0.7 mg/dL (ref 0.2–1.2)
Bilirubin, Direct: 0.1 mg/dL (ref 0.0–0.2)
GLOBULIN: 2.8 g/dL (ref 1.9–3.7)
TOTAL PROTEIN: 7.3 g/dL (ref 6.1–8.1)
Total Bilirubin: 0.8 mg/dL (ref 0.2–1.2)

## 2017-05-31 LAB — TSH: TSH: 0.54 m[IU]/L

## 2017-05-31 LAB — BASIC METABOLIC PANEL WITH GFR
BUN: 9 mg/dL (ref 7–25)
CALCIUM: 10.2 mg/dL (ref 8.6–10.2)
CO2: 28 mmol/L (ref 20–32)
CREATININE: 0.87 mg/dL (ref 0.50–1.10)
Chloride: 105 mmol/L (ref 98–110)
GFR, EST AFRICAN AMERICAN: 97 mL/min/{1.73_m2} (ref >=60)
GFR, Est Non African American: 83 mL/min/{1.73_m2} (ref >=60)
GLUCOSE: 99 mg/dL (ref 65–99)
Potassium: 5.1 mmol/L (ref 3.5–5.3)
Sodium: 141 mmol/L (ref 135–146)

## 2017-05-31 LAB — CBC WITH DIFFERENTIAL/PLATELET
BASOS PCT: 0.8 %
Basophils Absolute: 63 cells/uL (ref 0–200)
EOS ABS: 269 {cells}/uL (ref 15–500)
Eosinophils Relative: 3.4 %
HCT: 40.8 % (ref 35.0–45.0)
HEMOGLOBIN: 14 g/dL (ref 11.7–15.5)
Lymphs Abs: 2410 cells/uL (ref 850–3900)
MCH: 29.1 pg (ref 27.0–33.0)
MCHC: 34.3 g/dL (ref 32.0–36.0)
MCV: 84.8 fL (ref 80.0–100.0)
MPV: 10.9 fL (ref 7.5–12.5)
Monocytes Relative: 5.5 %
NEUTROS ABS: 4724 {cells}/uL (ref 1500–7800)
Neutrophils Relative %: 59.8 %
Platelets: 342 10*3/uL (ref 140–400)
RBC: 4.81 10*6/uL (ref 3.80–5.10)
RDW: 12.2 % (ref 11.0–15.0)
Total Lymphocyte: 30.5 %
WBC: 7.9 10*3/uL (ref 3.8–10.8)
WBCMIX: 435 {cells}/uL (ref 200–950)

## 2017-05-31 NOTE — Patient Instructions (Addendum)
Your ears and sinuses are connected by the eustachian tube. When your sinuses are inflamed, this can close off the tube and cause fluid to collect in your middle ear. This can then cause dizziness, popping, clicking, ringing, and echoing in your ears. This is often NOT an infection and does NOT require antibiotics, it is caused by inflammation so the treatments help the inflammation. This can take a long time to get better so please be patient.  Here are things you can do to help with this: - Try the Flonase or Nasonex. Remember to spray each nostril twice towards the outer part of your eye.  Do not sniff but instead pinch your nose and tilt your head back to help the medicine get into your sinuses.  The best time to do this is at bedtime.Stop if you get blurred vision or nose bleeds.  -While drinking fluids, pinch and hold nose close and swallow, to help open eustachian tubes to drain fluid behind ear drums. -Please pick one of the over the counter allergy medications below and take it once daily for allergies.  It will also help with fluid behind ear drums. Claritin or loratadine cheapest but likely the weakest  Zyrtec or certizine at night because it can make you sleepy The strongest is allegra or fexafinadine  Cheapest at [employer], [employer], [employer] -can use decongestant over the counter, please do not use if you have high blood pressure or certain heart conditions.   if worsening HA, changes vision/speech, imbalance, weakness go to the ER   Go to the ER if you have any new weakness in your legs, have trouble controlling your urine or bowels, or have worsening pain.   If you are not better in 1-3 month we will refer you to ortho   Back pain Rehab Ask your health care provider which exercises are safe for you. Do exercises exactly as told by your health care provider and adjust them as directed. It is normal to feel mild stretching, pulling, tightness, or discomfort as you do these exercises, but  you should stop right away if you feel sudden pain or your pain gets worse.Do not begin these exercises until told by your health care provider. Stretching and range of motion exercises These exercises warm up your muscles and joints and improve the movement and flexibility of your hips and your back. These exercises also help to relieve pain, numbness, and tingling. Exercise A: Sciatic nerve glide 1. Sit in a chair with your head facing down toward your chest. Place your hands behind your back. Let your shoulders slump forward. 2. Slowly straighten one of your knees while you tilt your head back as if you are looking toward the ceiling. Only straighten your leg as far as you can without making your symptoms worse. 3. Hold for __________ seconds. 4. Slowly return to the starting position. 5. Repeat with your other leg. Repeat __________ times. Complete this exercise __________ times a day. Exercise B: Knee to chest with hip adduction and internal rotation  1. Lie on your back on a firm surface with both legs straight. 2. Bend one of your knees and move it up toward your chest until you feel a gentle stretch in your lower back and buttock. Then, move your knee toward the shoulder that is on the opposite side from your leg. ? Hold your leg in this position by holding onto the front of your knee. 3. Hold for __________ seconds. 4. Slowly return to the starting  position. 5. Repeat with your other leg. Repeat __________ times. Complete this exercise __________ times a day. Exercise C: Prone extension on elbows  1. Lie on your abdomen on a firm surface. A bed may be too soft for this exercise. 2. Prop yourself up on your elbows. 3. Use your arms to help lift your chest up until you feel a gentle stretch in your abdomen and your lower back. ? This will place some of your body weight on your elbows. If this is uncomfortable, try stacking pillows under your chest. ? Your hips should stay down,  against the surface that you are lying on. Keep your hip and back muscles relaxed. 4. Hold for __________ seconds. 5. Slowly relax your upper body and return to the starting position. Repeat __________ times. Complete this exercise __________ times a day. Strengthening exercises These exercises build strength and endurance in your back. Endurance is the ability to use your muscles for a long time, even after they get tired. Exercise D: Pelvic tilt 1. Lie on your back on a firm surface. Bend your knees and keep your feet flat. 2. Tense your abdominal muscles. Tip your pelvis up toward the ceiling and flatten your lower back into the floor. ? To help with this exercise, you may place a small towel under your lower back and try to push your back into the towel. 3. Hold for __________ seconds. 4. Let your muscles relax completely before you repeat this exercise. Repeat __________ times. Complete this exercise __________ times a day. Exercise E: Alternating arm and leg raises  1. Get on your hands and knees on a firm surface. If you are on a hard floor, you may want to use padding to cushion your knees, such as an exercise mat. 2. Line up your arms and legs. Your hands should be below your shoulders, and your knees should be below your hips. 3. Lift your left leg behind you. At the same time, raise your right arm and straighten it in front of you. ? Do not lift your leg higher than your hip. ? Do not lift your arm higher than your shoulder. ? Keep your abdominal and back muscles tight. ? Keep your hips facing the ground. ? Do not arch your back. ? Keep your balance carefully, and do not hold your breath. 4. Hold for __________ seconds. 5. Slowly return to the starting position and repeat with your right leg and your left arm. Repeat __________ times. Complete this exercise __________ times a day. Posture and body mechanics  Body mechanics refers to the movements and positions of your body  while you do your daily activities. Posture is part of body mechanics. Good posture and healthy body mechanics can help to relieve stress in your body's tissues and joints. Good posture means that your spine is in its natural S-curve position (your spine is neutral), your shoulders are pulled back slightly, and your head is not tipped forward. The following are general guidelines for applying improved posture and body mechanics to your everyday activities. Standing   When standing, keep your spine neutral and your feet about hip-width apart. Keep a slight bend in your knees. Your ears, shoulders, and hips should line up.  When you do a task in which you stand in one place for a long time, place one foot up on a stable object that is 2-4 inches (5-10 cm) high, such as a footstool. This helps keep your spine neutral. Sitting   When sitting,  keep your spine neutral and keep your feet flat on the floor. Use a footrest, if necessary, and keep your thighs parallel to the floor. Avoid rounding your shoulders, and avoid tilting your head forward.  When working at a desk or a computer, keep your desk at a height where your hands are slightly lower than your elbows. Slide your chair under your desk so you are close enough to maintain good posture.  When working at a computer, place your monitor at a height where you are looking straight ahead and you do not have to tilt your head forward or downward to look at the screen. Resting   When lying down and resting, avoid positions that are most painful for you.  If you have pain with activities such as sitting, bending, stooping, or squatting (flexion-based activities), lie in a position in which your body does not bend very much. For example, avoid curling up on your side with your arms and knees near your chest (fetal position).  If you have pain with activities such as standing for a long time or reaching with your arms (extension-based activities), lie  with your spine in a neutral position and bend your knees slightly. Try the following positions: ? Lying on your side with a pillow between your knees. ? Lying on your back with a pillow under your knees. Lifting   When lifting objects, keep your feet at least shoulder-width apart and tighten your abdominal muscles.  Bend your knees and hips and keep your spine neutral. It is important to lift using the strength of your legs, not your back. Do not lock your knees straight out.  Always ask for help to lift heavy or awkward objects. This information is not intended to replace advice given to you by your health care provider. Make sure you discuss any questions you have with your health care provider. Document Released: 03/21/2005 Document Revised: 11/26/2015 Document Reviewed: 12/05/2014 Elsevier Interactive Patient Education  [employer].

## 2017-06-01 ENCOUNTER — Other Ambulatory Visit: Payer: Self-pay | Admitting: Physician Assistant

## 2017-06-01 DIAGNOSIS — M545 Low back pain, unspecified: Secondary | ICD-10-CM

## 2017-06-01 DIAGNOSIS — G8929 Other chronic pain: Secondary | ICD-10-CM

## 2017-06-09 ENCOUNTER — Other Ambulatory Visit (INDEPENDENT_AMBULATORY_CARE_PROVIDER_SITE_OTHER): Payer: Self-pay

## 2017-06-09 DIAGNOSIS — F3341 Major depressive disorder, recurrent, in partial remission: Secondary | ICD-10-CM

## 2017-06-10 ENCOUNTER — Other Ambulatory Visit: Payer: Self-pay | Admitting: Internal Medicine

## 2017-06-10 DIAGNOSIS — F3341 Major depressive disorder, recurrent, in partial remission: Secondary | ICD-10-CM

## 2017-06-10 MED ORDER — ALPRAZOLAM 0.5 MG PO TABS: ORAL_TABLET | ORAL | 0 refills | Status: DC

## 2017-06-14 ENCOUNTER — Ambulatory Visit (INDEPENDENT_AMBULATORY_CARE_PROVIDER_SITE_OTHER): Payer: Self-pay | Admitting: Surgery

## 2017-06-21 ENCOUNTER — Ambulatory Visit (INDEPENDENT_AMBULATORY_CARE_PROVIDER_SITE_OTHER): Payer: Self-pay | Admitting: Surgery

## 2017-07-05 ENCOUNTER — Encounter (INDEPENDENT_AMBULATORY_CARE_PROVIDER_SITE_OTHER): Payer: Self-pay | Admitting: Surgery

## 2017-07-05 ENCOUNTER — Ambulatory Visit (INDEPENDENT_AMBULATORY_CARE_PROVIDER_SITE_OTHER): Payer: 59

## 2017-07-05 ENCOUNTER — Ambulatory Visit (INDEPENDENT_AMBULATORY_CARE_PROVIDER_SITE_OTHER): Payer: 59 | Admitting: Surgery

## 2017-07-05 VITALS — Ht 69.0 in | Wt 152.0 lb

## 2017-07-05 DIAGNOSIS — G8929 Other chronic pain: Secondary | ICD-10-CM

## 2017-07-05 DIAGNOSIS — M4316 Spondylolisthesis, lumbar region: Secondary | ICD-10-CM

## 2017-07-05 DIAGNOSIS — M545 Low back pain: Secondary | ICD-10-CM | POA: Diagnosis not present

## 2017-07-05 MED ORDER — METHOCARBAMOL 500 MG PO TABS: 500.0000 mg | ORAL_TABLET | Freq: Three times a day (TID) | ORAL | 0 refills | Status: DC | PRN

## 2017-07-05 MED ORDER — METHYLPREDNISOLONE 4 MG PO TABS: ORAL_TABLET | ORAL | 0 refills | Status: DC

## 2017-07-05 NOTE — Progress Notes (Signed)
Office Visit Note   Patient: Alyssa Skinner           Date of Birth: 1975-05-15           MRN: [id number] Visit Date: 07/05/2017              Requested by: Quentin Mulling, PA-C [address] [place], [place] [address] PCP: Lucky Cowboy, MD   Assessment & Plan: Visit Diagnoses:  1. Chronic midline low back pain without sciatica   2. Spondylolisthesis, lumbar region     Plan: Reviewed patient's x-rays with her today.  Advised her that I actually believe that she has more findings than what was described on her radiologist report from x-rays done in February.  Patient given prescription for Medrol Dosepak 6-day taper to be taken as directed and Robaxin 500 mg 1 tab p.o. every 8 hours as needed for spasms.  Was also given a prescription to do formal PT for at least 6 weeks and they will also instruct home excise program and to work conditioning program as well.  Patient will follow up with me in 4 weeks for recheck.  If she does not have any improvement from my conservative treatment the next step would be to get a lumbar MRI.  Follow-Up Instructions: Return in about 1 month (around 08/02/2017).   Orders:  Orders Placed This Encounter  Procedures  . XR Lumbar Spine 2-3 Views   Meds ordered this encounter  Medications  . methylPREDNISolone (MEDROL) 4 MG tablet    Sig: 6 day taper to be taken as directed    Dispense:  21 tablet    Refill:  0  . methocarbamol (ROBAXIN) 500 MG tablet    Sig: Take 1 tablet (500 mg total) by mouth every 8 (eight) hours as needed for muscle spasms.    Dispense:  40 tablet    Refill:  0      Procedures: No procedures performed   Clinical Data: No additional findings.   Subjective: Chief Complaint  Patient presents with  . Lower Back - Pain    HPI 42 year old white female comes in today with complaints of chronic low back pain.  They states that she has had off and on since her low back pain since her pregnancy a little over  3 years ago.  During her pregnancy she states that she had constant back pain with some sciatica but the sciatica got better after her delivery.  Patient works from home with [employer] and does a lot of sitting.  At times pain described being in the central low back and extends into the bilateral buttocks.  Nothing radiating down her legs.  No complaints of numbness tingling weakness.  She uses ibuprofen as needed without any improvement.  Symptoms aggravated with prolonged sitting, bending, twisting.  She feels like she does have popping in her central low back that is not necessary painful when this happens.  Patient was recently seen by her primary care provider and had x-rays May 31, 2017 which showed:  CLINICAL DATA:  Mid lower back pain and frequent popping for over a year, no known injury, sits at her job most of the day  EXAM: LUMBAR SPINE - COMPLETE 4+ VIEW  COMPARISON:  None  FINDINGS: Hypoplastic last ribs.  5 additional non-rib-bearing lumbar type vertebra.  Osseous mineralization normal.  Disc space narrowing L4-L5.  Vertebral body and remaining disc space heights maintained.  No acute fracture, subluxation, or bone destruction.  SI  joints preserved.  No spondylolysis.  IMPRESSION: Mild degenerative disc disease changes at L4-L5.  No acute abnormalities.   Electronically Signed   By: Ulyses SouthwardMark  Boles M.D.   On: 05/31/2017 12:51    Review of Systems No current cardiac pulmonary GI GU issues  Objective: Vital Signs: Ht 5\' 9"  (1.753 m)   Wt 152 lb (68.9 kg)   BMI 22.45 kg/m   Physical Exam  Constitutional: She is oriented to person, place, and time. She appears well-developed. No distress.  HENT:  Head: Normocephalic and atraumatic.  Eyes: Pupils are equal, round, and reactive to light. EOM are normal.  Neck: Normal range of motion.  Pulmonary/Chest: No respiratory distress.  Musculoskeletal:  Gait is normal.  She has good  lumbar flexion with hands ankles.  Lumbar extension does cause some central low back pain.  Patient is actually able to reproduce popping over the L4-5 and L5-S1 disc levels.  No lumbar paraspinal tenderness.  Nontender over the bilateral SI joints.  She is mildly tender over the right hip greater trochanter bursa.  Nontender over the left side.  Negative logroll.  Negative straight leg raise.  Negative bilateral FABER test.  Neurovascular intact.  No focal motor deficits.  Bilateral calves nontender.  Neurological: She is alert and oriented to person, place, and time.  Skin: Skin is warm and dry.  Psychiatric: She has a normal mood and affect.    Ortho Exam  Specialty Comments:  No specialty comments available.  Imaging: No results found.   PMFS History: Patient Active Problem List   Diagnosis Date Noted  . Vitamin D deficiency 10/01/2014  . Medication management 10/01/2014  . SVD (spontaneous vaginal delivery) 05/12/2014  . Hypothyroid   . Depression   . Anxiety    Past Medical History:  Diagnosis Date  . Anxiety   . Depression   . Hypothyroid     Family History  Problem Relation Age of Onset  . Hyperlipidemia Mother   . Hyperlipidemia Father     Past Surgical History:  Procedure Laterality Date  . HEART CHAMBER REVISION  1979   Social History   Occupational History  . Not on file  Tobacco Use  . Smoking status: Former Smoker    Last attempt to quit: 08/02/2013    Years since quitting: 3.9  . Smokeless tobacco: Never Used  Substance and Sexual Activity  . Alcohol use: Yes    Alcohol/week: 0.0 oz    Comment: Rare wen not pregnant  . Drug use: No  . Sexual activity: Yes    Birth control/protection: None

## 2017-07-13 ENCOUNTER — Other Ambulatory Visit: Payer: Self-pay | Admitting: Internal Medicine

## 2017-07-13 DIAGNOSIS — F3341 Major depressive disorder, recurrent, in partial remission: Secondary | ICD-10-CM

## 2017-07-13 MED ORDER — ALPRAZOLAM 0.5 MG PO TABS: ORAL_TABLET | ORAL | 0 refills | Status: DC

## 2017-08-09 ENCOUNTER — Ambulatory Visit (INDEPENDENT_AMBULATORY_CARE_PROVIDER_SITE_OTHER): Payer: 59 | Admitting: Surgery

## 2017-08-16 ENCOUNTER — Other Ambulatory Visit: Payer: Self-pay | Admitting: Internal Medicine

## 2017-08-17 ENCOUNTER — Other Ambulatory Visit: Payer: Self-pay | Admitting: Internal Medicine

## 2017-08-17 DIAGNOSIS — F3341 Major depressive disorder, recurrent, in partial remission: Secondary | ICD-10-CM

## 2017-08-17 MED ORDER — ALPRAZOLAM 0.5 MG PO TABS: ORAL_TABLET | ORAL | 0 refills | Status: AC

## 2017-09-30 ENCOUNTER — Other Ambulatory Visit: Payer: Self-pay | Admitting: Physician Assistant

## 2017-10-09 ENCOUNTER — Other Ambulatory Visit: Payer: Self-pay | Admitting: Internal Medicine

## 2017-10-09 DIAGNOSIS — F3341 Major depressive disorder, recurrent, in partial remission: Secondary | ICD-10-CM

## 2017-10-09 MED ORDER — ALPRAZOLAM 0.5 MG PO TABS: ORAL_TABLET | ORAL | 0 refills | Status: DC

## 2017-10-26 ENCOUNTER — Encounter: Payer: Self-pay | Admitting: Physician Assistant

## 2017-11-22 ENCOUNTER — Other Ambulatory Visit: Payer: Self-pay | Admitting: Internal Medicine

## 2017-11-23 ENCOUNTER — Other Ambulatory Visit: Payer: Self-pay | Admitting: Internal Medicine

## 2017-11-23 DIAGNOSIS — F3341 Major depressive disorder, recurrent, in partial remission: Secondary | ICD-10-CM

## 2017-11-23 MED ORDER — ALPRAZOLAM 0.5 MG PO TABS: ORAL_TABLET | ORAL | 0 refills | Status: DC

## 2017-12-18 ENCOUNTER — Encounter: Payer: Self-pay | Admitting: Physician Assistant

## 2017-12-18 NOTE — Progress Notes (Deleted)
Complete Physical  Assessment and Plan:  Routine general medical examination at a health care facility Follow up GYN  Hypothyroidism, unspecified type Hypothyroidism-check TSH level, continue medications the same, reminded to take on an empty stomach 30-61mns before food.  -     TSH  Recurrent major depressive disorder, in partial remission (HCC) -     sertraline (ZOLOFT) 100 MG tablet; Take 1/2 to 1 tablet daily.  Anxiety Discussed decreasing xanax use, will not refill xanax at this time and will decrease amount given Mainly using as sleep, discussed good for short term but NOT long term use, hand out given, will try melatonin and sleep hygiene at night, if not better can try trazodone -     sertraline (ZOLOFT) 100 MG tablet; Take 1/2 to 1 tablet daily.  Vitamin D deficiency Continue supplement  Medication management -     CBC with Differential/Platelet -     BASIC METABOLIC PANEL WITH GFR -     Hepatic function panel -     Magnesium  Screening cholesterol level -     Lipid panel  Screening for hematuria or proteinuria -     Urinalysis, Routine w reflex microscopic -     Microalbumin / creatinine urine ratio  Screening, anemia, deficiency, iron -     Iron and TIBC -     Vitamin B12  Discussed med's effects and SE's. Screening labs and tests as requested with regular follow-up as recommended. Over 40 minutes of exam, counseling, chart review, and complex, high level critical decision making was performed this visit.   HPI  42y.o. female  presents for a complete physical and follow up for has Hypothyroid; Depression; Anxiety; Vitamin D deficiency; and Medication management on their problem list..  Her blood pressure has been controlled at home, today their BP is   She does not workout. She denies chest pain, shortness of breath, dizziness.  States she is doing better with monophasic CP, has follow up GYN next month.  She will take xanax occ during the day but normally  a half or whole nightly, has never tried other sleep aids, is on zoloft.  She is not on cholesterol medication and denies myalgias. Her cholesterol is at goal. The cholesterol last visit was:   Lab Results  Component Value Date   CHOL 193 10/26/2016   HDL 42 (L) 10/26/2016   LDLCALC 125 (H) 10/26/2016   TRIG 131 10/26/2016   CHOLHDL 4.6 10/26/2016   . Last A1C in the office was:  Lab Results  Component Value Date   HGBA1C 5.4 10/01/2015   Patient is on Vitamin D supplement.   Lab Results  Component Value Date   VD25OH 53806/29/2017     She is on thyroid medication, taking it in the AM, waiting an hour. Her medication was changed last visit. She is on 1 daily and did not increase it but trying to be better with when she takes it.  Lab Results  Component Value Date   TSH 0.54 05/31/2017  .  BMI is There is no height or weight on file to calculate BMI., she is working on diet and exercise. Wt Readings from Last 3 Encounters:  07/05/17 152 lb (68.9 kg)  05/31/17 152 lb (68.9 kg)  10/26/16 154 lb 12.8 oz (70.2 kg)    Current Medications:  Current Outpatient Medications on File Prior to Visit  Medication Sig Dispense Refill  . ALPRAZolam (XANAX) 0.5 MG tablet Take 1/2 to  1 tablet 1 to 2 x  /day ONLY if needed for Anxiety Attack and please try to limit to 5 days /week to avoid addiction 60 tablet 0  . Cholecalciferol (VITAMIN D3) 5000 units TABS Take 5,000 Units by mouth daily.    Marland Kitchen ibuprofen (ADVIL,MOTRIN) 800 MG tablet Take 1 tablet (800 mg total) by mouth every 8 (eight) hours as needed. 60 tablet 1  . KURVELO 0.15-30 MG-MCG tablet TAKE 1 TABLET BY MOUTH ONCE DAILY 84 tablet 3  . levothyroxine (SYNTHROID, LEVOTHROID) 175 MCG tablet TAKE 1 TABLET BY MOUTH ONCE DAILY BEFORE BREAKFAST 90 tablet 0  . methocarbamol (ROBAXIN) 500 MG tablet Take 1 tablet (500 mg total) by mouth every 8 (eight) hours as needed for muscle spasms. 40 tablet 0  . methylPREDNISolone (MEDROL) 4 MG tablet 6  day taper to be taken as directed 21 tablet 0  . sertraline (ZOLOFT) 100 MG tablet Take 1/2 to 1 tablet daily. 90 tablet 3   No current facility-administered medications on file prior to visit.    Allergies:  No Known Allergies   Medical History:  She has Hypothyroid; Depression; Anxiety; Vitamin D deficiency; and Medication management on their problem list.   Health Maintenance:   Immunization History  Administered Date(s) Administered  . Influenza, Seasonal, Injecte, Preservative Fre 04/13/2016  . MMR 05/14/2014   Tetanus: 2-3 year ago Pneumovax: Prevnar 13:  Flu vaccine: 2017 Zostavax:  No LMP recorded. Pap: 2016, has OV this month MGM: 2015 CAT D  DEXA: Colonoscopy: EGD:  Patient Care Team: Unk Pinto, MD as PCP - General (Internal Medicine) Dian Queen, MD as Consulting Physician (Obstetrics and Gynecology)  Surgical History:  She has a past surgical history that includes Heart chamber revision (1979). Family History:  Herfamily history includes Hyperlipidemia in her father and mother. Social History:  She reports that she quit smoking about 4 years ago. She has never used smokeless tobacco. She reports that she drinks alcohol. She reports that she does not use drugs.  Review of Systems: Review of Systems  Constitutional: Negative.   HENT: Negative.   Eyes: Negative.   Respiratory: Negative.   Cardiovascular: Negative.   Gastrointestinal: Negative.   Genitourinary: Negative.   Musculoskeletal: Positive for back pain.  Skin: Negative.   Neurological: Negative.   Endo/Heme/Allergies: Negative.   Psychiatric/Behavioral: Negative.     Physical Exam: Estimated body mass index is 22.45 kg/m as calculated from the following:   Height as of 07/05/17: '5\' 9"'  (1.753 m).   Weight as of 07/05/17: 152 lb (68.9 kg). There were no vitals taken for this visit. General Appearance: Well nourished, in no apparent distress.  Eyes: PERRLA, EOMs, conjunctiva no  swelling or erythema, normal fundi and vessels.  Sinuses: No Frontal/maxillary tenderness  ENT/Mouth: Ext aud canals clear, normal light reflex with TMs without erythema, bulging. Good dentition. No erythema, swelling, or exudate on post pharynx. Tonsils not swollen or erythematous. Hearing normal.  Neck: Supple, thyroid normal. No bruits  Respiratory: Respiratory effort normal, BS equal bilaterally without rales, rhonchi, wheezing or stridor.  Cardio: RRR without murmurs, rubs or gallops. Brisk peripheral pulses without edema.  Chest: symmetric, with normal excursions and percussion.  Breasts: defer Abdomen: Soft, nontender, no guarding, rebound, hernias, masses, or organomegaly.  Lymphatics: Non tender without lymphadenopathy.  Genitourinary: defer Musculoskeletal: Full ROM all peripheral extremities,5/5 strength, and normal gait. Negative straight leg, no TPP on lower back, good distal neurovascular.   Skin: Warm, dry without rashes, lesions, ecchymosis. Neuro:  Cranial nerves intact, reflexes equal bilaterally. Normal muscle tone, no cerebellar symptoms. Sensation intact.  Psych: Awake and oriented X 3, normal affect, Insight and Judgment appropriate.   EKG: defer AORTA SCAN: defer  Vicie Mutters 3:26 PM [facility]

## 2017-12-21 ENCOUNTER — Encounter: Payer: Self-pay | Admitting: Physician Assistant

## 2018-01-25 ENCOUNTER — Telehealth: Payer: Self-pay | Admitting: Physician Assistant

## 2018-01-25 DIAGNOSIS — F3341 Major depressive disorder, recurrent, in partial remission: Secondary | ICD-10-CM

## 2018-01-25 MED ORDER — ALPRAZOLAM 0.5 MG PO TABS: ORAL_TABLET | ORAL | 0 refills | Status: DC

## 2018-01-25 NOTE — Telephone Encounter (Signed)
-----   Message from Gregery Na, CMA sent at 01/24/2018 10:50 AM EDT ----- Regarding: refill Per pt/Yellow note:   Refill on XANAX Please & Thank You!   FYI: pharmacy:[facility]

## 2018-02-28 ENCOUNTER — Other Ambulatory Visit: Payer: Self-pay | Admitting: Internal Medicine

## 2018-02-28 ENCOUNTER — Other Ambulatory Visit: Payer: Self-pay

## 2018-02-28 DIAGNOSIS — F419 Anxiety disorder, unspecified: Secondary | ICD-10-CM

## 2018-02-28 DIAGNOSIS — F3341 Major depressive disorder, recurrent, in partial remission: Secondary | ICD-10-CM

## 2018-02-28 MED ORDER — ALPRAZOLAM 0.5 MG PO TABS: ORAL_TABLET | ORAL | 0 refills | Status: DC

## 2018-02-28 MED ORDER — LEVOTHYROXINE SODIUM 175 MCG PO TABS: 175.0000 ug | ORAL_TABLET | Freq: Every day | ORAL | 0 refills | Status: DC

## 2018-02-28 MED ORDER — SERTRALINE HCL 100 MG PO TABS: ORAL_TABLET | ORAL | 3 refills | Status: DC

## 2018-03-06 NOTE — Progress Notes (Signed)
Complete Physical  Assessment and Plan:  Routine general medical examination at a health care facility Follow up GYN  Hypothyroidism, unspecified type Hypothyroidism-check TSH level, continue medications the same, reminded to take on an empty stomach 30-64mns before food.  -     TSH  Recurrent major depressive disorder, in partial remission (HCC) -     sertraline (ZOLOFT) 100 MG tablet; Take 1 tablet daily.   Anxiety Discussed decreasing xanax use, use as needed Increase zoloft to 1078mDiscussed CBT therapy, given numbers and told to contact insurance for people on her list Follow up 3 months -     sertraline (ZOLOFT) 100 MG tablet; Take 1/2 to 1 tablet daily.  Vitamin D deficiency Continue supplement  Medication management -     CBC with Differential/Platelet -     BASIC METABOLIC PANEL WITH GFR -     Hepatic function panel -     Magnesium  Screening cholesterol level -     Lipid panel  Screening for hematuria or proteinuria -     Urinalysis, Routine w reflex microscopic -     Microalbumin / creatinine urine ratio  Discussed med's effects and SE's. Screening labs and tests as requested with regular follow-up as recommended. Over 40 minutes of exam, counseling, chart review, and complex, high level critical decision making was performed this visit.  Future Appointments  Date Time Provider [place]12/12/2018 10:00 AM CoVicie MuttersPA-C [facility] None    HPI  [age].o. female  presents for a complete physical and follow up for has Hypothyroid; Depression; Anxiety; Vitamin D deficiency; and Medication management on their problem list..  Her blood pressure has been controlled at home, today their BP is BP: 112/68 She does not workout. She denies chest pain, shortness of breath, dizziness.    She will take xanax occ during the day but normally a half or whole nightly, only on zoloft 5027mstates she has been more stressed, she is tearful, she will get  frustrated and has a short fuse with her kids which she does not want.   She is not on cholesterol medication and denies myalgias. Her cholesterol is at goal. The cholesterol last visit was:   Lab Results  Component Value Date   CHOL 193 10/26/2016   HDL 42 (L) 10/26/2016   LDLCALC 125 (H) 10/26/2016   TRIG 131 10/26/2016   CHOLHDL 4.6 10/26/2016   . Last A1C in the office was:  Lab Results  Component Value Date   HGBA1C 5.4 10/01/2015   Patient is on Vitamin D supplement.   Lab Results  Component Value Date   VD25OH 50 37/29/2017     She is on thyroid medication, taking it in the AM, waiting an hour. Her medication was changed last visit. She is on 1 daily and did not increase it but trying to be better with when she takes it.  Lab Results  Component Value Date   TSH 0.54 05/31/2017  .  BMI is Body mass index is 24.78 kg/m., she is working on diet and exercise. Wt Readings from Last 3 Encounters:  03/07/18 167 lb 12.8 oz (76.1 kg)  07/05/17 152 lb (68.9 kg)  05/31/17 152 lb (68.9 kg)    Current Medications:  Current Outpatient Medications on File Prior to Visit  Medication Sig Dispense Refill  . ALPRAZolam (XANAX) 0.5 MG tablet Take 1/2 to 1 tablet 1 to 2 x  /day ONLY if needed for Anxiety Attack and  please try to limit to 5 days /week to avoid addiction 60 tablet 0  . Cholecalciferol (VITAMIN D3) 5000 units TABS Take 5,000 Units by mouth daily.    Marland Kitchen ibuprofen (ADVIL,MOTRIN) 800 MG tablet Take 1 tablet (800 mg total) by mouth every 8 (eight) hours as needed. 60 tablet 1  . KURVELO 0.15-30 MG-MCG tablet TAKE 1 TABLET BY MOUTH ONCE DAILY 84 tablet 3  . levothyroxine (SYNTHROID, LEVOTHROID) 175 MCG tablet Take 1 tablet (175 mcg total) by mouth daily before breakfast. 90 tablet 0  . sertraline (ZOLOFT) 100 MG tablet Take 1/2 to 1 tablet daily. 90 tablet 3   No current facility-administered medications on file prior to visit.    Allergies:  No Known Allergies   Medical  History:  She has Hypothyroid; Depression; Anxiety; Vitamin D deficiency; and Medication management on their problem list.   Health Maintenance:   Immunization History  Administered Date(s) Administered  . Influenza, Seasonal, Injecte, Preservative Fre 04/13/2016  . MMR 05/14/2014   Tetanus: 2-3 year ago Pneumovax: Prevnar 13:  Flu vaccine: 2017 Zostavax:  Patient's last menstrual period was 03/07/2018. Pap: 2016, has OV this month MGM: 2015 CAT D  DEXA: Colonoscopy: EGD:  Patient Care Team: Unk Pinto, MD as PCP - General (Internal Medicine) Dian Queen, MD as Consulting Physician (Obstetrics and Gynecology)  Surgical History:  She has a past surgical history that includes Heart chamber revision (1979). Family History:  Herfamily history includes Hyperlipidemia in her father and mother. Social History:  She reports that she quit smoking about 4 years ago. She has never used smokeless tobacco. She reports that she drinks alcohol. She reports that she does not use drugs.  Review of Systems: Review of Systems  Constitutional: Negative.   HENT: Negative.   Eyes: Negative.   Respiratory: Negative.   Cardiovascular: Negative.   Gastrointestinal: Negative.   Genitourinary: Negative.   Musculoskeletal: Positive for back pain.  Skin: Negative.   Neurological: Negative.   Endo/Heme/Allergies: Negative.   Psychiatric/Behavioral: Negative.     Physical Exam: Estimated body mass index is 24.78 kg/m as calculated from the following:   Height as of 07/05/17: '5\' 9"'  (1.753 m).   Weight as of this encounter: 167 lb 12.8 oz (76.1 kg). BP 112/68   Pulse 87   Temp (!) 97.4 F (36.3 C)   Wt 167 lb 12.8 oz (76.1 kg)   LMP 03/07/2018   SpO2 98%   BMI 24.78 kg/m  General Appearance: Well nourished, in no apparent distress.  Eyes: PERRLA, EOMs, conjunctiva no swelling or erythema, normal fundi and vessels.  Sinuses: No Frontal/maxillary tenderness  ENT/Mouth: Ext aud  canals clear, normal light reflex with TMs without erythema, bulging. Good dentition. No erythema, swelling, or exudate on post pharynx. Tonsils not swollen or erythematous. Hearing normal.  Neck: Supple, thyroid normal. No bruits  Respiratory: Respiratory effort normal, BS equal bilaterally without rales, rhonchi, wheezing or stridor.  Cardio: RRR without murmurs, rubs or gallops. Brisk peripheral pulses without edema.  Chest: symmetric, with normal excursions and percussion.  Breasts: defer Abdomen: Soft, nontender, no guarding, rebound, hernias, masses, or organomegaly.  Lymphatics: Non tender without lymphadenopathy.  Genitourinary: defer Musculoskeletal: Full ROM all peripheral extremities,5/5 strength, and normal gait. Negative straight leg, no TPP on lower back, good distal neurovascular.   Skin: Warm, dry without rashes, lesions, ecchymosis. Neuro: Cranial nerves intact, reflexes equal bilaterally. Normal muscle tone, no cerebellar symptoms. Sensation intact.  Psych: Awake and oriented X 3, normal affect,  Insight and Judgment appropriate.   EKG: defer AORTA SCAN: defer  Vicie Mutters 10:22 AM [facility]

## 2018-03-07 ENCOUNTER — Encounter: Payer: Self-pay | Admitting: Physician Assistant

## 2018-03-07 ENCOUNTER — Ambulatory Visit (INDEPENDENT_AMBULATORY_CARE_PROVIDER_SITE_OTHER): Payer: 59 | Admitting: Physician Assistant

## 2018-03-07 VITALS — BP 112/68 | HR 87 | Temp 97.4°F | Wt 167.8 lb

## 2018-03-07 DIAGNOSIS — Z0000 Encounter for general adult medical examination without abnormal findings: Secondary | ICD-10-CM | POA: Diagnosis not present

## 2018-03-07 DIAGNOSIS — Z1389 Encounter for screening for other disorder: Secondary | ICD-10-CM

## 2018-03-07 DIAGNOSIS — F3341 Major depressive disorder, recurrent, in partial remission: Secondary | ICD-10-CM

## 2018-03-07 DIAGNOSIS — Z79899 Other long term (current) drug therapy: Secondary | ICD-10-CM

## 2018-03-07 DIAGNOSIS — F419 Anxiety disorder, unspecified: Secondary | ICD-10-CM

## 2018-03-07 DIAGNOSIS — Z13220 Encounter for screening for lipoid disorders: Secondary | ICD-10-CM

## 2018-03-07 DIAGNOSIS — Z23 Encounter for immunization: Secondary | ICD-10-CM

## 2018-03-07 DIAGNOSIS — E039 Hypothyroidism, unspecified: Secondary | ICD-10-CM

## 2018-03-07 DIAGNOSIS — E559 Vitamin D deficiency, unspecified: Secondary | ICD-10-CM

## 2018-03-07 MED ORDER — MELOXICAM 15 MG PO TABS: ORAL_TABLET | ORAL | 1 refills | Status: DC

## 2018-03-07 NOTE — Patient Instructions (Addendum)
Mobic is an antiinflammatory It helps pain, can not take with aleve, or ibuprofen You can take tylenol (500mg ) or tylenol arthritis (650mg ) with the meloxicam/antiinflammatories. The max you can take of tylenol a day is 3000mg  daily, this is a max of 6 pills a day of the regular tyelnol (500mg ) or a max of 4 a day of the tylenol arthritis (650mg ) as long as no other medications you are taking contain tylenol.   Mobic can cause inflammation in your stomach and can cause ulcers or bleeding, this will look like black tarry stools Make sure you take your mobic with food Try not to take it daily, take AS needed Can take with zantac/pepcid  Do the stretches.   Counseling services  Here are some numbers below you can try but I suggest calling your insurance and finding out who is in your network and THEN calling those people or looking them up on google.   I'm a big fan of Cognitive Behavioral Therapy, look this up on You tube or check with the therapist you see if they are certified.  This form of therapy helps to teach you skills to better handle with current situation that are causing anxiety or depression.   Increase the zoloft to 1 pill every day.   Know what a healthy weight is for you (roughly BMI <25) and aim to maintain this  Aim for 7+ servings of fruits and vegetables daily  65-80+ fluid ounces of water or unsweet tea for healthy kidneys  Limit to max 1 drink of alcohol per day; avoid smoking/tobacco  Limit animal fats in diet for cholesterol and heart health - choose grass fed whenever available  Avoid highly processed foods, and foods high in saturated/trans fats  Aim for low stress - take time to unwind and care for your mental health  Aim for 150 min of moderate intensity exercise weekly for heart health, and weights twice weekly for bone health  Aim for 7-9 hours of sleep daily   Google mindful eating and here are some tips and tricks below.   Rate your hunger before  you eat on a scale of 1-10, try to eat closer to a 6 or higher. And if you are at below that, why are you eating? Slow down and listen to your body.

## 2018-03-07 NOTE — Progress Notes (Signed)
done

## 2018-03-08 LAB — COMPLETE METABOLIC PANEL WITH GFR
AG RATIO: 1.5 (calc) (ref 1.0–2.5)
ALBUMIN MSPROF: 4.3 g/dL (ref 3.6–5.1)
ALT: 21 U/L (ref 6–29)
AST: 16 U/L (ref 10–30)
Alkaline phosphatase (APISO): 51 U/L (ref 33–115)
BUN: 10 mg/dL (ref 7–25)
CALCIUM: 9.7 mg/dL (ref 8.6–10.2)
CHLORIDE: 103 mmol/L (ref 98–110)
CO2: 28 mmol/L (ref 20–32)
Creat: 0.78 mg/dL (ref 0.50–1.10)
GFR, EST AFRICAN AMERICAN: 110 mL/min/{1.73_m2} (ref >=60)
GFR, EST NON AFRICAN AMERICAN: 95 mL/min/{1.73_m2} (ref >=60)
Globulin: 2.8 g/dL (calc) (ref 1.9–3.7)
Glucose, Bld: 78 mg/dL (ref 65–99)
POTASSIUM: 4.3 mmol/L (ref 3.5–5.3)
Sodium: 139 mmol/L (ref 135–146)
TOTAL PROTEIN: 7.1 g/dL (ref 6.1–8.1)
Total Bilirubin: 0.4 mg/dL (ref 0.2–1.2)

## 2018-03-08 LAB — URINALYSIS, ROUTINE W REFLEX MICROSCOPIC
BILIRUBIN URINE: NEGATIVE
Bacteria, UA: NONE SEEN /HPF
GLUCOSE, UA: NEGATIVE
Hyaline Cast: NONE SEEN /LPF
KETONES UR: NEGATIVE
Leukocytes, UA: NEGATIVE
NITRITE: NEGATIVE
PROTEIN: NEGATIVE
RBC / HPF: NONE SEEN /HPF (ref 0–2)
Specific Gravity, Urine: 1.009 (ref 1.001–1.03)
Squamous Epithelial / LPF: NONE SEEN /HPF (ref <=5)
WBC UA: NONE SEEN /HPF (ref 0–5)
pH: 6.5 (ref 5.0–8.0)

## 2018-03-08 LAB — CBC WITH DIFFERENTIAL/PLATELET
BASOS ABS: 21 {cells}/uL (ref 0–200)
BASOS PCT: 0.3 %
EOS ABS: 400 {cells}/uL (ref 15–500)
EOS PCT: 5.8 %
HEMATOCRIT: 40.2 % (ref 35.0–45.0)
Hemoglobin: 13.7 g/dL (ref 11.7–15.5)
Lymphs Abs: 2277 cells/uL (ref 850–3900)
MCH: 29.5 pg (ref 27.0–33.0)
MCHC: 34.1 g/dL (ref 32.0–36.0)
MCV: 86.6 fL (ref 80.0–100.0)
MPV: 11.5 fL (ref 7.5–12.5)
Monocytes Relative: 5.4 %
NEUTROS ABS: 3830 {cells}/uL (ref 1500–7800)
Neutrophils Relative %: 55.5 %
Platelets: 339 10*3/uL (ref 140–400)
RBC: 4.64 10*6/uL (ref 3.80–5.10)
RDW: 12.5 % (ref 11.0–15.0)
Total Lymphocyte: 33 %
WBC mixed population: 373 cells/uL (ref 200–950)
WBC: 6.9 10*3/uL (ref 3.8–10.8)

## 2018-03-08 LAB — LIPID PANEL
CHOLESTEROL: 210 mg/dL — AB (ref <200)
HDL: 57 mg/dL (ref >50)
LDL Cholesterol (Calc): 135 mg/dL (calc) — ABNORMAL HIGH
Non-HDL Cholesterol (Calc): 153 mg/dL (calc) — ABNORMAL HIGH (ref <130)
TRIGLYCERIDES: 83 mg/dL (ref <150)
Total CHOL/HDL Ratio: 3.7 (calc) (ref <5.0)

## 2018-03-08 LAB — MICROALBUMIN / CREATININE URINE RATIO
Creatinine, Urine: 44 mg/dL (ref 20–275)
Microalb, Ur: <0.2 mg/dL

## 2018-03-08 LAB — TSH: TSH: 5.44 mIU/L — ABNORMAL HIGH

## 2018-03-08 LAB — MAGNESIUM: Magnesium: 2 mg/dL (ref 1.5–2.5)

## 2018-03-08 LAB — VITAMIN D 25 HYDROXY (VIT D DEFICIENCY, FRACTURES): Vit D, 25-Hydroxy: 30 ng/mL (ref 30–100)

## 2018-03-09 ENCOUNTER — Other Ambulatory Visit: Payer: Self-pay | Admitting: Physician Assistant

## 2018-03-09 DIAGNOSIS — E039 Hypothyroidism, unspecified: Secondary | ICD-10-CM

## 2018-04-03 ENCOUNTER — Other Ambulatory Visit: Payer: Self-pay

## 2018-04-03 DIAGNOSIS — F3341 Major depressive disorder, recurrent, in partial remission: Secondary | ICD-10-CM

## 2018-04-03 MED ORDER — ALPRAZOLAM 0.5 MG PO TABS: ORAL_TABLET | ORAL | 0 refills | Status: DC

## 2018-05-23 ENCOUNTER — Other Ambulatory Visit: Payer: Self-pay

## 2018-05-23 DIAGNOSIS — F3341 Major depressive disorder, recurrent, in partial remission: Secondary | ICD-10-CM

## 2018-05-23 MED ORDER — ALPRAZOLAM 0.5 MG PO TABS: ORAL_TABLET | ORAL | 0 refills | Status: DC

## 2018-06-10 NOTE — Progress Notes (Signed)
Assessment and Plan:   Hypertension -Continue medication, monitor blood pressure at home. Continue DASH diet.  Reminder to go to the ER if any CP, SOB, nausea, dizziness, severe HA, changes vision/speech, left arm numbness and tingling and jaw pain.  Hypothryoidism Hypothyroidism-check TSH level, continue medications the same, reminded to take on an empty stomach 30-43mins before food.    Vitamin D Def - check level and continue medications.   Lower back pain Improving, may need PT if not improved  Continue diet and meds as discussed. Further disposition pending results of labs. Over 30 minutes of exam, counseling, chart review, and critical decision making was performed  Future Appointments  Date Time Provider Department Center  06/12/2018  8:45 AM Quentin Mulling, PA-C [facility] None  03/13/2019 10:00 AM Quentin Mulling, PA-C [facility] None     HPI 43 y.o. female  presents for 3 month follow up on hypertension, cholesterol, prediabetes, and vitamin D deficiency.    She is on zoloft daily and taking xanax 1-2 x a week for sleep.  Her blood pressure has been controlled at home, today their BP is BP: 118/84  She does workout. She denies chest pain, shortness of breath, dizziness.  She is not on cholesterol medication and denies myalgias. Her cholesterol is at goal. The cholesterol last visit was:   Lab Results  Component Value Date   CHOL 210 (H) 03/07/2018   HDL 57 03/07/2018   LDLCALC 135 (H) 03/07/2018   TRIG 83 03/07/2018   CHOLHDL 3.7 03/07/2018    She has been working on diet and exercise for prediabetes, and denies paresthesia of the feet, polydipsia, polyuria and visual disturbances. Last A1C in the office was:  Lab Results  Component Value Date   HGBA1C 5.4 10/01/2015   Patient is on Vitamin D supplement.   Lab Results  Component Value Date   VD25OH 30 03/07/2018     She is on thyroid medication. Her medication was not changed, she is on 175 daily.  She is  taking it with coffee in the morning.    Lab Results  Component Value Date   TSH 5.44 (H) 03/07/2018   BMI is Body mass index is 24.84 kg/m. Wt Readings from Last 3 Encounters:  06/12/18 168 lb 3.2 oz (76.3 kg)  03/07/18 167 lb 12.8 oz (76.1 kg)  07/05/17 152 lb (68.9 kg)    Current Medications:  Current Outpatient Medications on File Prior to Visit  Medication Sig Dispense Refill  . ALPRAZolam (XANAX) 0.5 MG tablet Take 1/2 to 1 tablet 1 to 2 x  /day ONLY if needed for Anxiety Attack and please try to limit to 5 days /week to avoid addiction 60 tablet 0  . Cholecalciferol (VITAMIN D3) 5000 units TABS Take 5,000 Units by mouth daily.    Marland Kitchen ibuprofen (ADVIL,MOTRIN) 800 MG tablet Take 1 tablet (800 mg total) by mouth every 8 (eight) hours as needed. 60 tablet 1  . KURVELO 0.15-30 MG-MCG tablet TAKE 1 TABLET BY MOUTH ONCE DAILY 84 tablet 3  . levothyroxine (SYNTHROID, LEVOTHROID) 175 MCG tablet Take 1 tablet (175 mcg total) by mouth daily before breakfast. 90 tablet 0  . meloxicam (MOBIC) 15 MG tablet Take one daily with food for 2 weeks, can take with tylenol, can not take with aleve, iburpofen, then as needed daily for pain 30 tablet 1  . sertraline (ZOLOFT) 100 MG tablet Take 1/2 to 1 tablet daily. 90 tablet 3   No current facility-administered medications on  file prior to visit.    Medical History:  Past Medical History:  Diagnosis Date  . Anxiety   . Depression   . Hypothyroid   . SVD (spontaneous vaginal delivery) 05/12/2014   Allergies: No Known Allergies   Review of Systems:  Review of Systems  Constitutional: Negative.   HENT: Negative.   Eyes: Negative.   Respiratory: Negative.   Cardiovascular: Negative.   Gastrointestinal: Negative.   Genitourinary: Negative.   Musculoskeletal: Positive for back pain.  Skin: Negative.   Neurological: Negative.   Endo/Heme/Allergies: Negative.   Psychiatric/Behavioral: Negative.     Family history- Review and unchanged Social  history- Review and unchanged Physical Exam: BP 118/84   Pulse 69   Temp 97.9 F (36.6 C)   Ht 5\' 9"  (1.753 m)   Wt 168 lb 3.2 oz (76.3 kg)   LMP 05/31/2018   SpO2 99%   BMI 24.84 kg/m  Wt Readings from Last 3 Encounters:  06/12/18 168 lb 3.2 oz (76.3 kg)  03/07/18 167 lb 12.8 oz (76.1 kg)  07/05/17 152 lb (68.9 kg)   General Appearance: Well nourished, in no apparent distress. Eyes: PERRLA, EOMs, conjunctiva no swelling or erythema Sinuses: No Frontal/maxillary tenderness ENT/Mouth: Ext aud canals clear, TMs without erythema, bulging. No erythema, swelling, or exudate on post pharynx.  Tonsils not swollen or erythematous. Hearing normal.  Neck: Supple, thyroid normal.  Respiratory: Respiratory effort normal, BS equal bilaterally without rales, rhonchi, wheezing or stridor.  Cardio: RRR with no MRGs. Brisk peripheral pulses without edema.  Abdomen: Soft, + BS,  Non tender, no guarding, rebound, hernias, masses. Lymphatics: Non tender without lymphadenopathy.  Musculoskeletal: Full ROM, 5/5 strength, Normal gait, Negative straight leg, no tenderness to palpation, + palpable popping with spinal rotation, full rom spine, normal distal neurovascular exam bilateral legs, good strength.  Skin: Warm, dry without rashes, lesions, ecchymosis.  Neuro: Cranial nerves intact. Normal muscle tone, no cerebellar symptoms. Psych: Awake and oriented X 3, normal affect, Insight and Judgment appropriate.    Quentin Mulling, PA-C 8:44 AM [facility]

## 2018-06-12 ENCOUNTER — Encounter: Payer: Self-pay | Admitting: Physician Assistant

## 2018-06-12 ENCOUNTER — Ambulatory Visit (INDEPENDENT_AMBULATORY_CARE_PROVIDER_SITE_OTHER): Payer: 59 | Admitting: Physician Assistant

## 2018-06-12 VITALS — BP 118/84 | HR 69 | Temp 97.9°F | Ht 69.0 in | Wt 168.2 lb

## 2018-06-12 DIAGNOSIS — F419 Anxiety disorder, unspecified: Secondary | ICD-10-CM | POA: Diagnosis not present

## 2018-06-12 DIAGNOSIS — Z79899 Other long term (current) drug therapy: Secondary | ICD-10-CM

## 2018-06-12 DIAGNOSIS — F3341 Major depressive disorder, recurrent, in partial remission: Secondary | ICD-10-CM

## 2018-06-12 DIAGNOSIS — E039 Hypothyroidism, unspecified: Secondary | ICD-10-CM | POA: Diagnosis not present

## 2018-06-12 DIAGNOSIS — E559 Vitamin D deficiency, unspecified: Secondary | ICD-10-CM

## 2018-06-12 DIAGNOSIS — Z13220 Encounter for screening for lipoid disorders: Secondary | ICD-10-CM

## 2018-06-12 LAB — CBC WITH DIFFERENTIAL/PLATELET
Absolute Monocytes: 342 cells/uL (ref 200–950)
Basophils Absolute: 12 cells/uL (ref 0–200)
Basophils Relative: 0.2 %
EOS PCT: 3.6 %
Eosinophils Absolute: 220 cells/uL (ref 15–500)
HEMATOCRIT: 39 % (ref 35.0–45.0)
Hemoglobin: 13.4 g/dL (ref 11.7–15.5)
Lymphs Abs: 1806 cells/uL (ref 850–3900)
MCH: 29.8 pg (ref 27.0–33.0)
MCHC: 34.4 g/dL (ref 32.0–36.0)
MCV: 86.7 fL (ref 80.0–100.0)
MPV: 11.1 fL (ref 7.5–12.5)
Monocytes Relative: 5.6 %
Neutro Abs: 3721 cells/uL (ref 1500–7800)
Neutrophils Relative %: 61 %
Platelets: 272 10*3/uL (ref 140–400)
RBC: 4.5 10*6/uL (ref 3.80–5.10)
RDW: 12.2 % (ref 11.0–15.0)
Total Lymphocyte: 29.6 %
WBC: 6.1 10*3/uL (ref 3.8–10.8)

## 2018-06-12 LAB — COMPLETE METABOLIC PANEL WITH GFR
AG Ratio: 1.9 (calc) (ref 1.0–2.5)
ALT: 18 U/L (ref 6–29)
AST: 15 U/L (ref 10–30)
Albumin: 4.6 g/dL (ref 3.6–5.1)
Alkaline phosphatase (APISO): 53 U/L (ref 31–125)
BUN: 8 mg/dL (ref 7–25)
CO2: 24 mmol/L (ref 20–32)
Calcium: 9.6 mg/dL (ref 8.6–10.2)
Chloride: 104 mmol/L (ref 98–110)
Creat: 0.8 mg/dL (ref 0.50–1.10)
GFR, Est African American: 106 mL/min/{1.73_m2} (ref >=60)
GFR, Est Non African American: 92 mL/min/{1.73_m2} (ref >=60)
Globulin: 2.4 g/dL (calc) (ref 1.9–3.7)
Glucose, Bld: 92 mg/dL (ref 65–99)
Potassium: 4 mmol/L (ref 3.5–5.3)
SODIUM: 138 mmol/L (ref 135–146)
Total Bilirubin: 0.6 mg/dL (ref 0.2–1.2)
Total Protein: 7 g/dL (ref 6.1–8.1)

## 2018-06-12 LAB — TSH: TSH: 0.1 mIU/L — ABNORMAL LOW

## 2018-06-12 LAB — LIPID PANEL
Cholesterol: 195 mg/dL (ref <200)
HDL: 49 mg/dL — ABNORMAL LOW (ref >=50)
LDL Cholesterol (Calc): 124 mg/dL (calc) — ABNORMAL HIGH
Non-HDL Cholesterol (Calc): 146 mg/dL (calc) — ABNORMAL HIGH (ref <130)
Total CHOL/HDL Ratio: 4 (calc) (ref <5.0)
Triglycerides: 109 mg/dL (ref <150)

## 2018-06-12 NOTE — Patient Instructions (Addendum)
Can do a steroid nasal spary 1-2 sparys at night each nostril.  Remember to spray each nostril twice towards the outer part of your eye.   Do not sniff but instead pinch your nose and tilt your head back to help the medicine get into your sinuses.   The best time to do this is at bedtime.  Stop if you get blurred vision or nose bleeds.   THIS WILL TAKE 7 DAYS TO WORK AND IS BETTER IF YOU START BEFORE SYMPTOMS SO IF YOU HAVE A SEASON OR TIME OF THE YEAR YOU ALWAYS GET A COLD, START BEFORE THAT!  COLD INFORMATION  Try just the allergy pill and prednisone for a few days, you always want to give your body 10 days to fight off infection with help before taking an anabiotic.   BEWARE ANTIBIOTICS Antibiotics have been linked with colon infections, resistance and newest theory is colon cancer in 40-50 year olds. So it is VERY important to try to avoid them, antibiotics are NOT risk free medications.   If you are not feeling better make an office visit OR CONTACT us.   Here is more info below HOW TO TREAT VIRAL COUGH AND COLD SYMPTOMS:  -Symptoms usually last at least 1 week with the worst symptoms being around day 4.  - colds usually start with a sore throat and end with a cough, and the cough can take 2 weeks to get better.  -No antibiotics are needed for colds, flu, sore throats, cough, bronchitis UNLESS symptoms are longer than 7 days OR if you are getting better then get drastically worse.  -There are a lot of combination medications (Dayquil, Nyquil, Vicks 44, tyelnol cold and sinus, ETC). Please look at the ingredients on the back so that you are treating the correct symptoms and not doubling up on medications/ingredients.    Medicines you can use  Nasal congestion  Little Remedies saline spray (aerosol/mist)- can try this, it is in the kids section - pseudoephedrine (Sudafed)- behind the counter, do not use if you have high blood pressure, medicine that have -D in them.  - phenylephrine  (Sudafed PE) -Dextormethorphan + chlorpheniramine (Coridcidin HBP)- okay if you have high blood pressure -Oxymetazoline (Afrin) nasal spray- LIMIT to 3 days -Saline nasal spray -Neti pot (used distilled or bottled water)  Ear pain/congestion  -pseudoephedrine (sudafed) - Nasonex/flonase nasal spray  Fever  -Acetaminophen (Tyelnol) -Ibuprofen (Advil, motrin, aleve)  Sore Throat  -Acetaminophen (Tyelnol) -Ibuprofen (Advil, motrin, aleve) -Drink a lot of water -Gargle with salt water - Rest your voice (don't talk) -Throat sprays -Cough drops  Body Aches  -Acetaminophen (Tyelnol) -Ibuprofen (Advil, motrin, aleve)  Headache  -Acetaminophen (Tyelnol) -Ibuprofen (Advil, motrin, aleve) - Exedrin, Exedrin Migraine  Allergy symptoms (cough, sneeze, runny nose, itchy eyes) -Claritin or loratadine cheapest but likely the weakest  -Zyrtec or certizine at night because it can make you sleepy -The strongest is allegra or fexafinadine  Cheapest at [employer], [employer], [employer]  Cough  -Dextromethorphan (Delsym)- medicine that has DM in it -Guafenesin (Mucinex/Robitussin) - cough drops - drink lots of water  Chest Congestion  -Guafenesin (Mucinex/Robitussin)  Red Itchy Eyes  - Naphcon-A  Upset Stomach  - Bland diet (nothing spicy, greasy, fried, and high acid foods like tomatoes, oranges, berries) -OKAY- cereal, bread, soup, crackers, rice -Eat smaller more frequent meals -reduce caffeine, no alcohol -Loperamide (Imodium-AD) if diarrhea -Prevacid for heart burn  General health when sick  -Hydration -wash your hands frequently -keep surfaces clean -  change pillow cases and sheets often -Get fresh air but do not exercise strenuously -Vitamin D, double up on it - Vitamin C -Zinc

## 2018-07-07 ENCOUNTER — Other Ambulatory Visit: Payer: 59 | Admitting: Adult Health

## 2018-07-07 DIAGNOSIS — N390 Urinary tract infection, site not specified: Secondary | ICD-10-CM

## 2018-07-07 DIAGNOSIS — R319 Hematuria, unspecified: Secondary | ICD-10-CM

## 2018-07-07 MED ORDER — CIPROFLOXACIN HCL 500 MG PO TABS: 500.0000 mg | ORAL_TABLET | Freq: Two times a day (BID) | ORAL | 0 refills | Status: AC

## 2018-07-07 MED ORDER — CIPROFLOXACIN HCL 500 MG PO TABS: 500.0000 mg | ORAL_TABLET | Freq: Two times a day (BID) | ORAL | 0 refills | Status: DC

## 2018-07-07 NOTE — Telephone Encounter (Signed)
Called patient to complete telephone based acute visit.

## 2018-07-07 NOTE — Progress Notes (Signed)
Virtual Visit via Telephone Note  I connected with Alyssa Skinner on 07/07/18 at 10:10 by telephone and verified that I am speaking with the correct person using two identifiers.   I discussed the limitations, risks, security and privacy concerns of performing an evaluation and management service by telephone and the availability of in person appointments. I also discussed with the patient that there may be a patient responsible charge related to this service. The patient expressed understanding and agreed to proceed.   History of Present Illness:   43 y.o. with depression/anxiety and hypothyroidism reports symptoms of dysuria, urgency, frequency, pelvic pressure with attempts to urinate and decreased urine output. She denies fever/chills, flank pain, nausea/vomiting. She does endorse faint pink tinge to urine, denies clots. She does have remote history of UTIs, denies hx of renal calculi or previous episodes of hematuria. Symptoms began yesterday. She reports symptoms do improve with pushing fluids but have been persistent. She suspects UTI. Denies perineal discomfort, vaginal discharge.    Observations/Objective:  General : Well sounding patient in no apparent distress HEENT: no hoarseness, no cough for duration of visit Lungs: speaks in complete sentences, no audible wheezing, no apparent distress Neurological: alert, oriented x 3 Psychiatric: pleasant, judgement appropriate    Assessment and Plan:  Diagnoses and all orders for this visit:  Urinary tract infection with hematuria, site unspecified Medications: ciprofloxacin. Maintain adequate hydration. Follow up if symptoms not improving, and as needed. -     ciprofloxacin (CIPRO) 500 MG tablet; Take 1 tablet (500 mg total) by mouth 2 (two) times daily for 5 days.   Follow Up Instructions:    I discussed the assessment and treatment plan with the patient. The patient was provided an opportunity to ask questions and all were  answered. The patient agreed with the plan and demonstrated an understanding of the instructions.   The patient was advised to call back or seek an in-person evaluation if the symptoms worsen or if the condition fails to improve as anticipated.  I provided 15 minutes of non-face-to-face time during this encounter.   Dan Maker, NP

## 2018-07-24 ENCOUNTER — Other Ambulatory Visit: Payer: Self-pay

## 2018-07-24 DIAGNOSIS — F3341 Major depressive disorder, recurrent, in partial remission: Secondary | ICD-10-CM

## 2018-07-24 MED ORDER — ALPRAZOLAM 0.5 MG PO TABS: ORAL_TABLET | ORAL | 0 refills | Status: DC

## 2018-08-10 NOTE — Telephone Encounter (Signed)
Spoke briefly with the patient via phone regarding her concerning insect bite; she reports improved today after applying ice yesterday, declines further interventions, she will continue to monitor. Advised she can message back if redness is spreading, with any new discharge from wound, if she has fever/chills. She can try topical antibiotic, hydrocortisone 1% OTC for itching, benadryl 25-50 mg at night for severe itching if needed. She expresses her thanks and understanding.

## 2018-10-04 ENCOUNTER — Other Ambulatory Visit: Payer: Self-pay

## 2018-10-04 DIAGNOSIS — F3341 Major depressive disorder, recurrent, in partial remission: Secondary | ICD-10-CM

## 2018-10-04 MED ORDER — ALPRAZOLAM 0.5 MG PO TABS: ORAL_TABLET | ORAL | 0 refills | Status: DC

## 2018-10-04 MED ORDER — LEVOTHYROXINE SODIUM 175 MCG PO TABS: ORAL_TABLET | ORAL | 1 refills | Status: DC

## 2018-11-05 NOTE — Progress Notes (Signed)
Assessment and Plan:   Hypertension -Continue medication, monitor blood pressure at home. Continue DASH diet.  Reminder to go to the ER if any CP, SOB, nausea, dizziness, severe HA, changes vision/speech, left arm numbness and tingling and jaw pain.  Hypothryoidism Hypothyroidism-check TSH level, continue medications the same, reminded to take on an empty stomach 30-5760mins before food.    Vitamin D Def - check level and continue medications.   Lower back pain may need PT if not improved, declines at this time  Continue diet and meds as discussed. Further disposition pending results of labs. Over 30 minutes of exam, counseling, chart review, and critical decision making was performed  Future Appointments  Date Time Provider Department Center  03/13/2019 10:00 AM Quentin Mullingollier, Harvey Matlack, PA-C [facility] None     HPI 43 y.o. female  presents for 3 month follow up on hypertension, cholesterol, prediabetes, and vitamin D deficiency.    She is on zoloft daily and taking xanax 1-2 x a week for sleep.  Her blood pressure has been controlled at home, today their BP is BP: 118/74  She does workout. She denies chest pain, shortness of breath, dizziness.  She is not on cholesterol medication and denies myalgias. Her cholesterol is at goal. The cholesterol last visit was:   Lab Results  Component Value Date   CHOL 195 06/12/2018   HDL 49 (L) 06/12/2018   LDLCALC 124 (H) 06/12/2018   TRIG 109 06/12/2018   CHOLHDL 4.0 06/12/2018    She has been working on diet and exercise for prediabetes, and denies paresthesia of the feet, polydipsia, polyuria and visual disturbances. Last A1C in the office was:  Lab Results  Component Value Date   HGBA1C 5.4 10/01/2015   Patient is on Vitamin D supplement, she is 5000.   Lab Results  Component Value Date   VD25OH 30 03/07/2018     She is on thyroid medication. Her medication was not changed, she is on 175 daily, and 1/2 on Sunday, just with water in the AM.     Lab Results  Component Value Date   TSH 0.10 (L) 06/12/2018   BMI is Body mass index is 24.54 kg/m. Wt Readings from Last 3 Encounters:  11/12/18 166 lb 3.2 oz (75.4 kg)  06/12/18 168 lb 3.2 oz (76.3 kg)  03/07/18 167 lb 12.8 oz (76.1 kg)    Current Medications:  Current Outpatient Medications on File Prior to Visit  Medication Sig Dispense Refill  . ALPRAZolam (XANAX) 0.5 MG tablet Take 1/2 to 1 tablet 1 to 2 x  /day ONLY if needed for Anxiety Attack and please try to limit to 5 days /week to avoid addiction 60 tablet 0  . Cholecalciferol (VITAMIN D3) 5000 units TABS Take 5,000 Units by mouth daily.    Marland Kitchen. ibuprofen (ADVIL,MOTRIN) 800 MG tablet Take 1 tablet (800 mg total) by mouth every 8 (eight) hours as needed. 60 tablet 1  . KURVELO 0.15-30 MG-MCG tablet TAKE 1 TABLET BY MOUTH ONCE DAILY 84 tablet 3  . levothyroxine (SYNTHROID) 175 MCG tablet Take 1 tablet daily on an empty stomach with only water for 30 minutes & no Antacid meds, Calcium or Magnesium for 4 hours & avoid Biotin 90 tablet 1  . sertraline (ZOLOFT) 100 MG tablet Take 1/2 to 1 tablet daily. 90 tablet 3   No current facility-administered medications on file prior to visit.    Medical History:  Past Medical History:  Diagnosis Date  . Anxiety   .  Depression   . Hypothyroid   . SVD (spontaneous vaginal delivery) 05/12/2014   Allergies: No Known Allergies   Review of Systems:  Review of Systems  Constitutional: Negative.   HENT: Negative.   Eyes: Negative.   Respiratory: Negative.   Cardiovascular: Negative.   Gastrointestinal: Negative.   Genitourinary: Negative.   Musculoskeletal: Positive for back pain.  Skin: Negative.   Neurological: Negative.   Endo/Heme/Allergies: Negative.   Psychiatric/Behavioral: Negative.     Family history- Review and unchanged Social history- Review and unchanged Physical Exam: BP 118/74   Pulse 66   Temp 97.6 F (36.4 C)   Ht 5\' 9"  (1.753 m)   Wt 166 lb 3.2 oz  (75.4 kg)   LMP 11/11/2018   SpO2 97%   BMI 24.54 kg/m  Wt Readings from Last 3 Encounters:  11/12/18 166 lb 3.2 oz (75.4 kg)  06/12/18 168 lb 3.2 oz (76.3 kg)  03/07/18 167 lb 12.8 oz (76.1 kg)   General Appearance: Well nourished, in no apparent distress. Eyes: PERRLA, EOMs, conjunctiva no swelling or erythema Sinuses: No Frontal/maxillary tenderness ENT/Mouth: Ext aud canals clear, TMs without erythema, bulging. No erythema, swelling, or exudate on post pharynx.  Tonsils not swollen or erythematous. Hearing normal.  Neck: Supple, thyroid normal.  Respiratory: Respiratory effort normal, BS equal bilaterally without rales, rhonchi, wheezing or stridor.  Cardio: RRR with no MRGs. Brisk peripheral pulses without edema.  Abdomen: Soft, + BS,  Non tender, no guarding, rebound, hernias, masses. Lymphatics: Non tender without lymphadenopathy.  Musculoskeletal: Full ROM, 5/5 strength, Normal gait, Negative straight leg, no tenderness to palpation, + palpable popping with spinal rotation, full rom spine, normal distal neurovascular exam bilateral legs, good strength.  Skin: Warm, dry without rashes, lesions, ecchymosis.  Neuro: Cranial nerves intact. Normal muscle tone, no cerebellar symptoms. Psych: Awake and oriented X 3, normal affect, Insight and Judgment appropriate.    Vicie Mutters, PA-C 9:31 AM [facility]

## 2018-11-09 ENCOUNTER — Other Ambulatory Visit: Payer: Self-pay

## 2018-11-09 DIAGNOSIS — F3341 Major depressive disorder, recurrent, in partial remission: Secondary | ICD-10-CM

## 2018-11-09 MED ORDER — ALPRAZOLAM 0.5 MG PO TABS: ORAL_TABLET | ORAL | 0 refills | Status: DC

## 2018-11-12 ENCOUNTER — Encounter: Payer: Self-pay | Admitting: Physician Assistant

## 2018-11-12 ENCOUNTER — Ambulatory Visit (INDEPENDENT_AMBULATORY_CARE_PROVIDER_SITE_OTHER): Payer: 59 | Admitting: Physician Assistant

## 2018-11-12 ENCOUNTER — Other Ambulatory Visit: Payer: Self-pay

## 2018-11-12 VITALS — BP 118/74 | HR 66 | Temp 97.6°F | Ht 69.0 in | Wt 166.2 lb

## 2018-11-12 DIAGNOSIS — Z79899 Other long term (current) drug therapy: Secondary | ICD-10-CM

## 2018-11-12 DIAGNOSIS — E559 Vitamin D deficiency, unspecified: Secondary | ICD-10-CM | POA: Diagnosis not present

## 2018-11-12 DIAGNOSIS — E039 Hypothyroidism, unspecified: Secondary | ICD-10-CM | POA: Diagnosis not present

## 2018-11-12 DIAGNOSIS — Z13220 Encounter for screening for lipoid disorders: Secondary | ICD-10-CM

## 2018-11-12 DIAGNOSIS — F3341 Major depressive disorder, recurrent, in partial remission: Secondary | ICD-10-CM | POA: Diagnosis not present

## 2018-11-12 NOTE — Patient Instructions (Signed)
VITAMIN D IS IMPORTANT  Vitamin D goal is between 60-80  Please make sure that you are taking your Vitamin D as directed.   It is very important as a natural anti-inflammatory   helping hair, skin, and nails, as well as reducing stroke and heart attack risk.   It helps your bones and helps with mood.  We want you on at least 5000 IU daily  It also decreases numerous cancer risks so please take it as directed.   Low Vit D is associated with a 200-300% higher risk for CANCER   and 200-300% higher risk for HEART   ATTACK  &  STROKE.    .....................................Marland Kitchen  It is also associated with higher death rate at younger ages,   autoimmune diseases like Rheumatoid arthritis, Lupus, Multiple Sclerosis.     Also many other serious conditions, like depression, Alzheimer's  Dementia, infertility, muscle aches, fatigue, fibromyalgia - just to name a few.  +++++++++++++++++++  Can get liquid vitamin D from [place] here in [place] at  [facility] alternatives [address], [place], [place] [address] Or you can try earth fare   Low back strain  Ice low back for 30min today  Then change to heat, warm shower may help also  We will send in Prednisone taper and flexeril to take as needed for muscle spasms.  If not improvement after 7-10 days please contact office  Once improved, then work on exercise to strengthen this area.  Below are examples and you can find them on-line as well.  Use reputable sites from physical therapy or from universities.

## 2018-11-13 LAB — CBC WITH DIFFERENTIAL/PLATELET
Absolute Monocytes: 383 cells/uL (ref 200–950)
Basophils Absolute: 23 cells/uL (ref 0–200)
Basophils Relative: 0.3 %
Eosinophils Absolute: 248 cells/uL (ref 15–500)
Eosinophils Relative: 3.3 %
HCT: 41.3 % (ref 35.0–45.0)
Hemoglobin: 13.5 g/dL (ref 11.7–15.5)
Lymphs Abs: 1508 cells/uL (ref 850–3900)
MCH: 29.7 pg (ref 27.0–33.0)
MCHC: 32.7 g/dL (ref 32.0–36.0)
MCV: 90.8 fL (ref 80.0–100.0)
MPV: 11.3 fL (ref 7.5–12.5)
Monocytes Relative: 5.1 %
Neutro Abs: 5340 cells/uL (ref 1500–7800)
Neutrophils Relative %: 71.2 %
Platelets: 300 10*3/uL (ref 140–400)
RBC: 4.55 10*6/uL (ref 3.80–5.10)
RDW: 12.9 % (ref 11.0–15.0)
Total Lymphocyte: 20.1 %
WBC: 7.5 10*3/uL (ref 3.8–10.8)

## 2018-11-13 LAB — MAGNESIUM: Magnesium: 2 mg/dL (ref 1.5–2.5)

## 2018-11-13 LAB — COMPLETE METABOLIC PANEL WITH GFR
AG Ratio: 1.5 (calc) (ref 1.0–2.5)
ALT: 19 U/L (ref 6–29)
AST: 16 U/L (ref 10–30)
Albumin: 4.2 g/dL (ref 3.6–5.1)
Alkaline phosphatase (APISO): 51 U/L (ref 31–125)
BUN: 10 mg/dL (ref 7–25)
CO2: 26 mmol/L (ref 20–32)
Calcium: 9.9 mg/dL (ref 8.6–10.2)
Chloride: 104 mmol/L (ref 98–110)
Creat: 0.81 mg/dL (ref 0.50–1.10)
GFR, Est African American: 105 mL/min/{1.73_m2} (ref >=60)
GFR, Est Non African American: 90 mL/min/{1.73_m2} (ref >=60)
Globulin: 2.8 g/dL (calc) (ref 1.9–3.7)
Glucose, Bld: 88 mg/dL (ref 65–99)
Potassium: 5.1 mmol/L (ref 3.5–5.3)
Sodium: 139 mmol/L (ref 135–146)
Total Bilirubin: 0.7 mg/dL (ref 0.2–1.2)
Total Protein: 7 g/dL (ref 6.1–8.1)

## 2018-11-13 LAB — LIPID PANEL
Cholesterol: 220 mg/dL — ABNORMAL HIGH (ref <200)
HDL: 47 mg/dL — ABNORMAL LOW (ref >=50)
LDL Cholesterol (Calc): 151 mg/dL (calc) — ABNORMAL HIGH
Non-HDL Cholesterol (Calc): 173 mg/dL (calc) — ABNORMAL HIGH (ref <130)
Total CHOL/HDL Ratio: 4.7 (calc) (ref <5.0)
Triglycerides: 105 mg/dL (ref <150)

## 2018-11-13 LAB — VITAMIN D 25 HYDROXY (VIT D DEFICIENCY, FRACTURES): Vit D, 25-Hydroxy: 40 ng/mL (ref 30–100)

## 2018-11-13 LAB — TSH: TSH: 1.1 mIU/L

## 2018-11-18 ENCOUNTER — Other Ambulatory Visit: Payer: Self-pay | Admitting: Adult Health

## 2018-12-21 ENCOUNTER — Other Ambulatory Visit: Payer: Self-pay

## 2018-12-21 DIAGNOSIS — F3341 Major depressive disorder, recurrent, in partial remission: Secondary | ICD-10-CM

## 2018-12-21 MED ORDER — ALPRAZOLAM 0.5 MG PO TABS: ORAL_TABLET | ORAL | 0 refills | Status: DC

## 2019-01-03 ENCOUNTER — Encounter: Payer: Self-pay | Admitting: Physician Assistant

## 2019-01-21 ENCOUNTER — Other Ambulatory Visit: Payer: Self-pay | Admitting: Internal Medicine

## 2019-02-01 ENCOUNTER — Other Ambulatory Visit: Payer: Self-pay

## 2019-02-01 DIAGNOSIS — Z20822 Contact with and (suspected) exposure to covid-19: Secondary | ICD-10-CM

## 2019-02-03 LAB — NOVEL CORONAVIRUS, NAA: SARS-CoV-2, NAA: NOT DETECTED

## 2019-02-10 ENCOUNTER — Other Ambulatory Visit: Payer: Self-pay

## 2019-02-10 DIAGNOSIS — F3341 Major depressive disorder, recurrent, in partial remission: Secondary | ICD-10-CM

## 2019-02-10 MED ORDER — ALPRAZOLAM 0.5 MG PO TABS: ORAL_TABLET | ORAL | 0 refills | Status: DC

## 2019-03-11 NOTE — Progress Notes (Signed)
Complete Physical  Assessment and Plan:  Routine general medical examination at a health care facility Follow up GYN  Hypothyroidism, unspecified type Hypothyroidism-check TSH level, continue medications the same, reminded to take on an empty stomach 30-66mns before food.  -     TSH  Recurrent major depressive disorder, in partial remission (HCC) -     sertraline (ZOLOFT) 100 MG tablet; Take 1 tablet daily.   Anxiety Discussed decreasing xanax use, use as needed Increase zoloft to 1076mDiscussed CBT therapy, given numbers and told to contact insurance for people on her list Follow up 3 months -     sertraline (ZOLOFT) 100 MG tablet; Take 1/2 to 1 tablet daily.  Vitamin D deficiency Continue supplement  Medication management -     CBC with Differential/Platelet -     BASIC METABOLIC PANEL WITH GFR -     Hepatic function panel -     Magnesium  Screening cholesterol level -     Lipid panel  Screening for hematuria or proteinuria -     Urinalysis, Routine w reflex microscopic -     Microalbumin / creatinine urine ratio  Discussed med's effects and SE's. Screening labs and tests as requested with regular follow-up as recommended. Over 40 minutes of exam, counseling, chart review, and complex, high level critical decision making was performed this visit.  Future Appointments  Date Time Provider [place]12/13/2021 10:00 AM CoVicie MuttersPA-C [facility] None    HPI  [age].o. female  presents for a complete physical and follow up for has Hypothyroid; Depression; Anxiety; Vitamin D deficiency; and Medication management on their problem list..  Her blood pressure has been controlled at home, today their BP is BP: 120/80 She does not workout. She denies chest pain, shortness of breath, dizziness.    She will take xanax occ during the day, increased to zoloft 10035mHer daughter is in school 3 days a week, son is 2 days a week, she is working from home.    She is  not on cholesterol medication and denies myalgias. Her cholesterol is at goal. The cholesterol last visit was:   Lab Results  Component Value Date   CHOL 220 (H) 11/12/2018   HDL 47 (L) 11/12/2018   LDLCALC 151 (H) 11/12/2018   TRIG 105 11/12/2018   CHOLHDL 4.7 11/12/2018   . Last A1C in the office was:  Lab Results  Component Value Date   HGBA1C 5.4 10/01/2015   Patient is on Vitamin D supplement.   Lab Results  Component Value Date   VD25OH 40 11/12/2018     She is on thyroid medication, taking it in the AM, waiting an hour. Her medication was changed last visit. She is on 1 daily and did not increase it but trying to be better with when she takes it.  Lab Results  Component Value Date   TSH 1.10 11/12/2018  .  BMI is Body mass index is 23.61 kg/m., she is working on diet and exercise. Wt Readings from Last 3 Encounters:  03/13/19 157 lb 9.6 oz (71.5 kg)  11/12/18 166 lb 3.2 oz (75.4 kg)  06/12/18 168 lb 3.2 oz (76.3 kg)    Current Medications:  Current Outpatient Medications on File Prior to Visit  Medication Sig Dispense Refill  . ALPRAZolam (XANAX) 0.5 MG tablet Take 1/2 to 1 tablet 1 to 2 x  /day ONLY if needed for Anxiety Attack and please try to limit to 5 days /  week to avoid addiction 60 tablet 0  . Cholecalciferol (VITAMIN D3) 5000 units TABS Take 5,000 Units by mouth daily.    Marland Kitchen ibuprofen (ADVIL,MOTRIN) 800 MG tablet Take 1 tablet (800 mg total) by mouth every 8 (eight) hours as needed. 60 tablet 1  . levonorgestrel-ethinyl estradiol (KURVELO) 0.15-30 MG-MCG tablet Take 1 tablet Daily 84 tablet 3  . levothyroxine (EUTHYROX) 175 MCG tablet Take 1 tablet daily on an empty stomach with only water for 30 minutes & no Antacid meds, Calcium or Magnesium for 4 hours & avoid Biotin 90 tablet 1  . sertraline (ZOLOFT) 100 MG tablet Take 1/2 to 1 tablet daily. 90 tablet 3   No current facility-administered medications on file prior to visit.    Allergies:  No Known  Allergies   Medical History:  She has Hypothyroid; Depression; Anxiety; Vitamin D deficiency; and Medication management on their problem list.   Health Maintenance:   Immunization History  Administered Date(s) Administered  . Influenza Inj Mdck Quad With Preservative 03/07/2018  . Influenza, Seasonal, Injecte, Preservative Fre 04/13/2016  . MMR 05/14/2014   Tetanus: 2-3 year ago Pneumovax: Prevnar 13:  Flu vaccine: 2019 Zostavax:  Patient's last menstrual period was 03/03/2019. Pap: 2016, Need to schdule MGM: 2015 CAT D- need to schedule DEXA: Colonoscopy: EGD:  Patient Care Team: Unk Pinto, MD as PCP - General (Internal Medicine) Dian Queen, MD as Consulting Physician (Obstetrics and Gynecology)  Surgical History:  She has a past surgical history that includes Heart chamber revision (1979). Family History:  Herfamily history includes Hyperlipidemia in her father and mother. Social History:  She reports that she quit smoking about 5 years ago. She has never used smokeless tobacco. She reports current alcohol use. She reports that she does not use drugs.  Review of Systems: Review of Systems  Constitutional: Negative.   HENT: Negative.   Eyes: Negative.   Respiratory: Negative.   Cardiovascular: Negative.   Gastrointestinal: Negative.   Genitourinary: Negative.   Musculoskeletal: Positive for back pain.  Skin: Negative.   Neurological: Negative.   Endo/Heme/Allergies: Negative.   Psychiatric/Behavioral: Negative.     Physical Exam: Estimated body mass index is 23.61 kg/m as calculated from the following:   Height as of this encounter: 5' 8.5" (1.74 m).   Weight as of this encounter: 157 lb 9.6 oz (71.5 kg). BP 120/80   Pulse 63   Temp (!) 97.5 F (36.4 C)   Ht 5' 8.5" (1.74 m)   Wt 157 lb 9.6 oz (71.5 kg)   LMP 03/03/2019   SpO2 98%   BMI 23.61 kg/m  General Appearance: Well nourished, in no apparent distress.  Eyes: PERRLA, EOMs,  conjunctiva no swelling or erythema, normal fundi and vessels.  Sinuses: No Frontal/maxillary tenderness  ENT/Mouth: Ext aud canals clear, normal light reflex with TMs without erythema, bulging. Good dentition. No erythema, swelling, or exudate on post pharynx. Tonsils not swollen or erythematous. Hearing normal.  Neck: Supple, thyroid normal. No bruits  Respiratory: Respiratory effort normal, BS equal bilaterally without rales, rhonchi, wheezing or stridor.  Cardio: RRR without murmurs, rubs or gallops. Brisk peripheral pulses without edema.  Chest: symmetric, with normal excursions and percussion.  Breasts: defer Abdomen: Soft, nontender, no guarding, rebound, hernias, masses, or organomegaly.  Lymphatics: Non tender without lymphadenopathy.  Genitourinary: defer Musculoskeletal: Full ROM all peripheral extremities,5/5 strength, and normal gait.  Skin: Warm, dry without rashes, lesions, ecchymosis. Neuro: Cranial nerves intact, reflexes equal bilaterally. Normal muscle tone, no cerebellar  symptoms. Sensation intact.  Psych: Awake and oriented X 3, normal affect, Insight and Judgment appropriate.   EKG: defer AORTA SCAN: defer  Vicie Mutters 10:13 AM [facility]

## 2019-03-13 ENCOUNTER — Other Ambulatory Visit: Payer: Self-pay

## 2019-03-13 ENCOUNTER — Ambulatory Visit (INDEPENDENT_AMBULATORY_CARE_PROVIDER_SITE_OTHER): Payer: 59 | Admitting: Physician Assistant

## 2019-03-13 ENCOUNTER — Encounter: Payer: Self-pay | Admitting: Physician Assistant

## 2019-03-13 VITALS — BP 120/80 | HR 63 | Temp 97.5°F | Ht 68.5 in | Wt 157.6 lb

## 2019-03-13 DIAGNOSIS — Z1389 Encounter for screening for other disorder: Secondary | ICD-10-CM

## 2019-03-13 DIAGNOSIS — E039 Hypothyroidism, unspecified: Secondary | ICD-10-CM

## 2019-03-13 DIAGNOSIS — E559 Vitamin D deficiency, unspecified: Secondary | ICD-10-CM

## 2019-03-13 DIAGNOSIS — Z79899 Other long term (current) drug therapy: Secondary | ICD-10-CM

## 2019-03-13 DIAGNOSIS — Z0000 Encounter for general adult medical examination without abnormal findings: Secondary | ICD-10-CM | POA: Diagnosis not present

## 2019-03-13 DIAGNOSIS — F3341 Major depressive disorder, recurrent, in partial remission: Secondary | ICD-10-CM

## 2019-03-13 DIAGNOSIS — Z13220 Encounter for screening for lipoid disorders: Secondary | ICD-10-CM

## 2019-03-13 DIAGNOSIS — Z131 Encounter for screening for diabetes mellitus: Secondary | ICD-10-CM

## 2019-03-13 DIAGNOSIS — Z0001 Encounter for general adult medical examination with abnormal findings: Secondary | ICD-10-CM

## 2019-03-13 DIAGNOSIS — F419 Anxiety disorder, unspecified: Secondary | ICD-10-CM

## 2019-03-13 NOTE — Patient Instructions (Addendum)
   Drink 1/2 your body weight in fluid ounces of water daily; drink a tall glass of water 30 min before meals  Don't eat until you're stuffed- listen to your stomach and eat until you are 80% full   Try eating off of a salad plate; wait 10 min after finishing before going back for seconds  Start by eating the vegetables on your plate; aim for 50% of your meals to be fruits or vegetables  Then eat your protein - lean meats (grass fed if possible), fish, beans, nuts in moderation  Eat your carbs/starch last ONLY if you still are hungry. If you can, stop before finishing it all  Avoid sugar and flour - the closer it looks to it's original form in nature, typically the better it is for you  Splurge in moderation - "assign" days when you get to splurge and have the "bad stuff" - I like to follow a 80% - 20% plan- "good" choices 80 % of the time, "bad" choices in moderation 20% of the time  Simple equation is: Calories out > calories in = weight loss - even if you eat the bad stuff, if you limit portions, you will still lose weight  

## 2019-03-14 LAB — MICROALBUMIN / CREATININE URINE RATIO
Creatinine, Urine: 94 mg/dL (ref 20–275)
Microalb Creat Ratio: 9 mcg/mg creat (ref <30)
Microalb, Ur: 0.8 mg/dL

## 2019-03-14 LAB — COMPLETE METABOLIC PANEL WITH GFR
AG Ratio: 1.6 (calc) (ref 1.0–2.5)
ALT: 21 U/L (ref 6–29)
AST: 12 U/L (ref 10–30)
Albumin: 4.6 g/dL (ref 3.6–5.1)
Alkaline phosphatase (APISO): 67 U/L (ref 31–125)
BUN: 12 mg/dL (ref 7–25)
CO2: 29 mmol/L (ref 20–32)
Calcium: 10.3 mg/dL — ABNORMAL HIGH (ref 8.6–10.2)
Chloride: 100 mmol/L (ref 98–110)
Creat: 0.77 mg/dL (ref 0.50–1.10)
GFR, Est African American: 111 mL/min/{1.73_m2} (ref >=60)
GFR, Est Non African American: 96 mL/min/{1.73_m2} (ref >=60)
Globulin: 2.8 g/dL (calc) (ref 1.9–3.7)
Glucose, Bld: 84 mg/dL (ref 65–99)
Potassium: 4.5 mmol/L (ref 3.5–5.3)
Sodium: 138 mmol/L (ref 135–146)
Total Bilirubin: 1.1 mg/dL (ref 0.2–1.2)
Total Protein: 7.4 g/dL (ref 6.1–8.1)

## 2019-03-14 LAB — VITAMIN D 25 HYDROXY (VIT D DEFICIENCY, FRACTURES): Vit D, 25-Hydroxy: 39 ng/mL (ref 30–100)

## 2019-03-14 LAB — CBC WITH DIFFERENTIAL/PLATELET
Absolute Monocytes: 377 cells/uL (ref 200–950)
Basophils Absolute: 59 cells/uL (ref 0–200)
Basophils Relative: 0.9 %
Eosinophils Absolute: 371 cells/uL (ref 15–500)
Eosinophils Relative: 5.7 %
HCT: 41.7 % (ref 35.0–45.0)
Hemoglobin: 14.2 g/dL (ref 11.7–15.5)
Lymphs Abs: 1846 cells/uL (ref 850–3900)
MCH: 29.6 pg (ref 27.0–33.0)
MCHC: 34.1 g/dL (ref 32.0–36.0)
MCV: 86.9 fL (ref 80.0–100.0)
MPV: 11.5 fL (ref 7.5–12.5)
Monocytes Relative: 5.8 %
Neutro Abs: 3848 cells/uL (ref 1500–7800)
Neutrophils Relative %: 59.2 %
Platelets: 315 10*3/uL (ref 140–400)
RBC: 4.8 10*6/uL (ref 3.80–5.10)
RDW: 12.4 % (ref 11.0–15.0)
Total Lymphocyte: 28.4 %
WBC: 6.5 10*3/uL (ref 3.8–10.8)

## 2019-03-14 LAB — URINALYSIS, ROUTINE W REFLEX MICROSCOPIC
Bilirubin Urine: NEGATIVE
Glucose, UA: NEGATIVE
Hgb urine dipstick: NEGATIVE
Ketones, ur: NEGATIVE
Leukocytes,Ua: NEGATIVE
Nitrite: NEGATIVE
Protein, ur: NEGATIVE
Specific Gravity, Urine: 1.016 (ref 1.001–1.03)
pH: 7.5 (ref 5.0–8.0)

## 2019-03-14 LAB — HEMOGLOBIN A1C
Hgb A1c MFr Bld: 5.3 % of total Hgb (ref <5.7)
Mean Plasma Glucose: 105 (calc)
eAG (mmol/L): 5.8 (calc)

## 2019-03-14 LAB — LIPID PANEL
Cholesterol: 212 mg/dL — ABNORMAL HIGH (ref <200)
HDL: 52 mg/dL (ref >=50)
LDL Cholesterol (Calc): 134 mg/dL (calc) — ABNORMAL HIGH
Non-HDL Cholesterol (Calc): 160 mg/dL (calc) — ABNORMAL HIGH (ref <130)
Total CHOL/HDL Ratio: 4.1 (calc) (ref <5.0)
Triglycerides: 131 mg/dL (ref <150)

## 2019-03-14 LAB — TSH: TSH: 0.3 mIU/L — ABNORMAL LOW

## 2019-03-14 LAB — INSULIN, RANDOM: Insulin: 10.3 u[IU]/mL

## 2019-03-14 LAB — MAGNESIUM: Magnesium: 2 mg/dL (ref 1.5–2.5)

## 2019-04-03 ENCOUNTER — Other Ambulatory Visit: Payer: Self-pay

## 2019-04-03 DIAGNOSIS — F3341 Major depressive disorder, recurrent, in partial remission: Secondary | ICD-10-CM

## 2019-04-07 ENCOUNTER — Other Ambulatory Visit: Payer: Self-pay

## 2019-04-07 DIAGNOSIS — F3341 Major depressive disorder, recurrent, in partial remission: Secondary | ICD-10-CM

## 2019-04-07 MED ORDER — ALPRAZOLAM 0.5 MG PO TABS: ORAL_TABLET | ORAL | 0 refills | Status: DC

## 2019-05-09 DIAGNOSIS — F3341 Major depressive disorder, recurrent, in partial remission: Secondary | ICD-10-CM

## 2019-05-09 DIAGNOSIS — F419 Anxiety disorder, unspecified: Secondary | ICD-10-CM

## 2019-05-09 DIAGNOSIS — G8929 Other chronic pain: Secondary | ICD-10-CM

## 2019-05-09 MED ORDER — SERTRALINE HCL 100 MG PO TABS: ORAL_TABLET | ORAL | 3 refills | Status: DC

## 2019-05-09 MED ORDER — LEVONORGESTREL-ETHINYL ESTRAD 0.15-30 MG-MCG PO TABS: ORAL_TABLET | ORAL | 3 refills | Status: DC

## 2019-05-09 MED ORDER — IBUPROFEN 800 MG PO TABS: 800.0000 mg | ORAL_TABLET | Freq: Three times a day (TID) | ORAL | 1 refills | Status: DC | PRN

## 2019-06-08 ENCOUNTER — Other Ambulatory Visit: Payer: Self-pay

## 2019-06-08 DIAGNOSIS — F3341 Major depressive disorder, recurrent, in partial remission: Secondary | ICD-10-CM

## 2019-06-09 ENCOUNTER — Other Ambulatory Visit: Payer: Self-pay

## 2019-06-09 DIAGNOSIS — F3341 Major depressive disorder, recurrent, in partial remission: Secondary | ICD-10-CM

## 2019-06-10 ENCOUNTER — Other Ambulatory Visit: Payer: Self-pay

## 2019-06-10 DIAGNOSIS — F3341 Major depressive disorder, recurrent, in partial remission: Secondary | ICD-10-CM

## 2019-06-10 MED ORDER — ALPRAZOLAM 0.5 MG PO TABS: ORAL_TABLET | ORAL | 0 refills | Status: DC

## 2019-08-06 ENCOUNTER — Other Ambulatory Visit: Payer: Self-pay

## 2019-08-06 DIAGNOSIS — F3341 Major depressive disorder, recurrent, in partial remission: Secondary | ICD-10-CM

## 2019-08-07 MED ORDER — ALPRAZOLAM 0.5 MG PO TABS: ORAL_TABLET | ORAL | 0 refills | Status: DC

## 2019-09-06 IMAGING — CR DG LUMBAR SPINE COMPLETE 4+V
5 series · 5 of 5 positions shown · non-contrast
Comparison: None

CLINICAL DATA: Mid lower back pain and frequent popping for over a
year, no known injury, sits at her job most of the day

EXAM:
LUMBAR SPINE - COMPLETE 4+ VIEW

[l-spine ap]
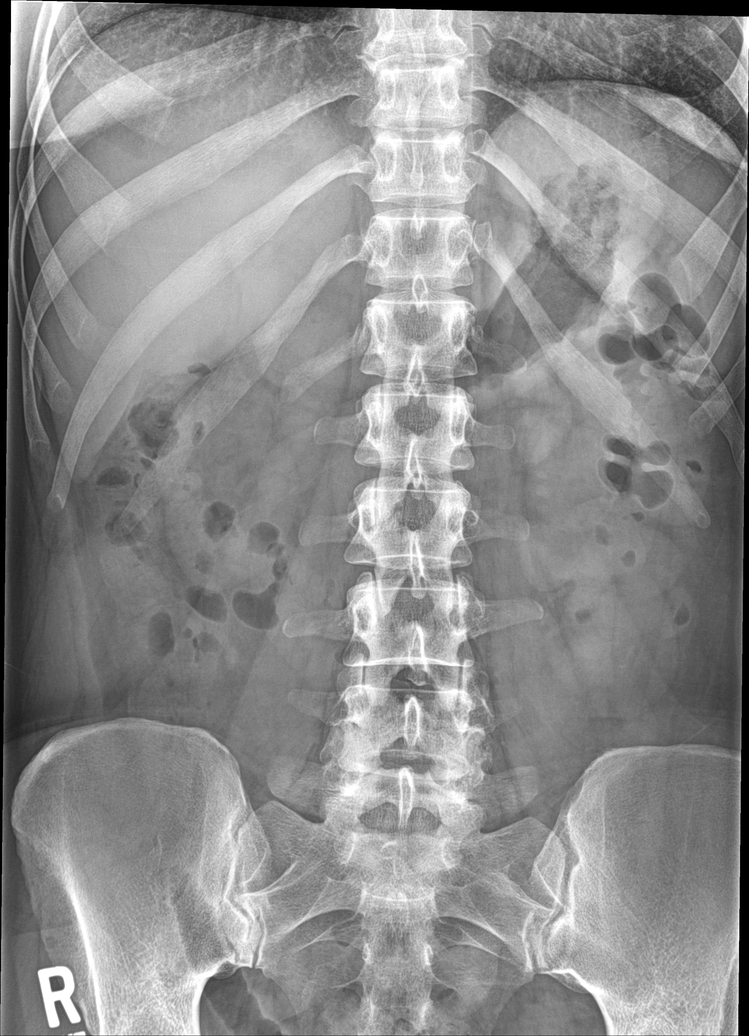

[l-spine obl (1 of 2)]
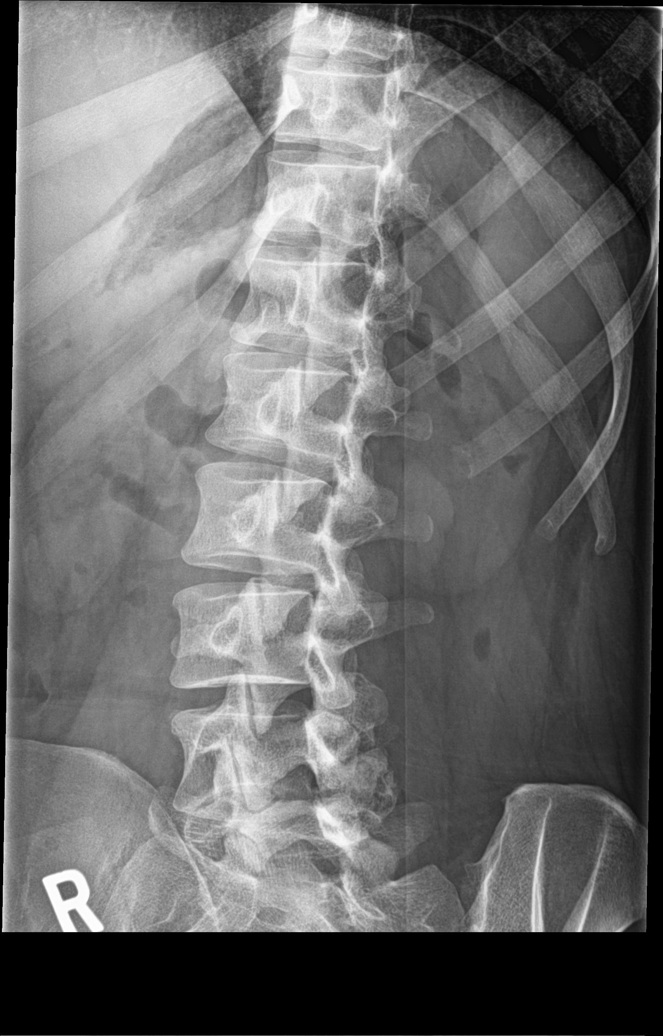

[l-spine obl (2 of 2)]
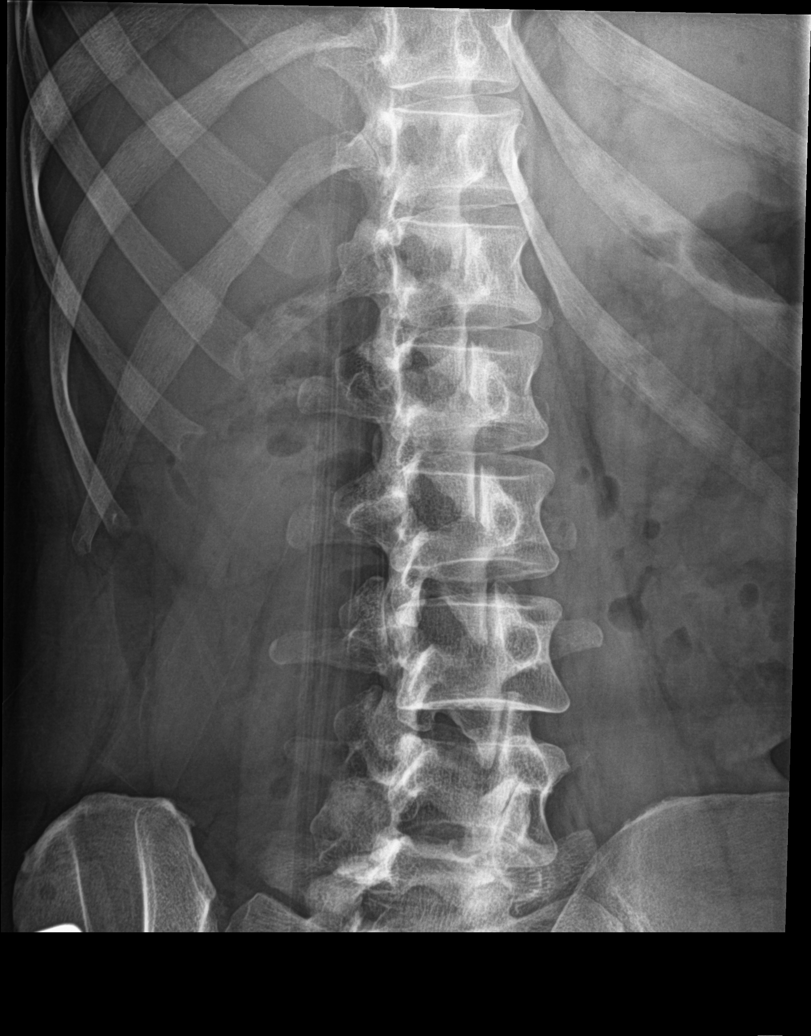

[l-spine lat]
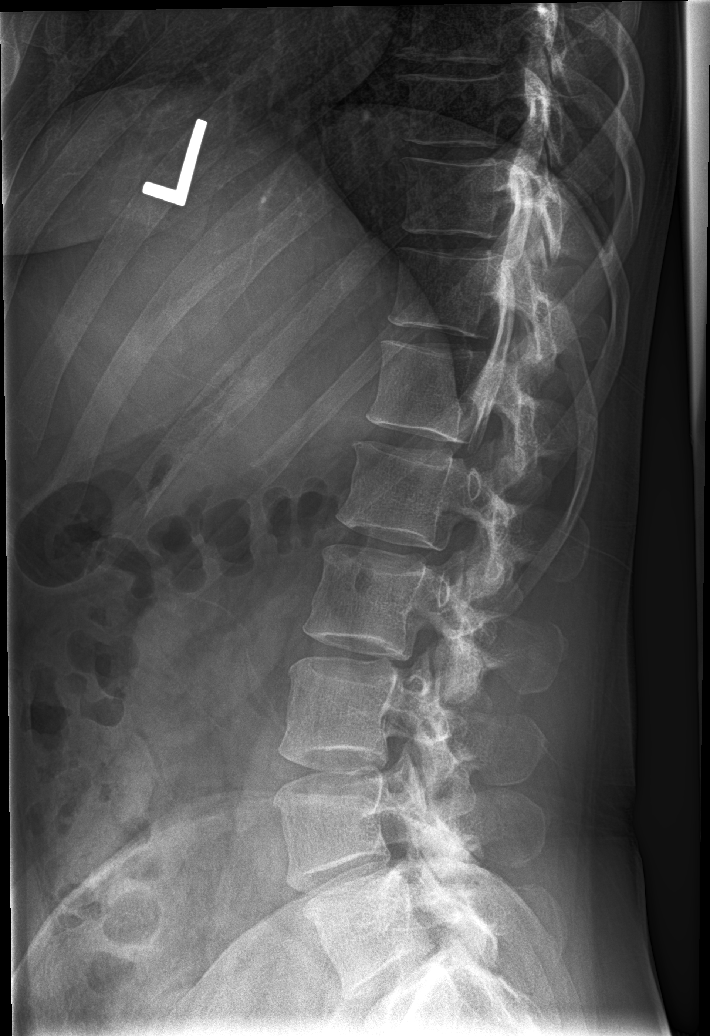

[l-spine spot]
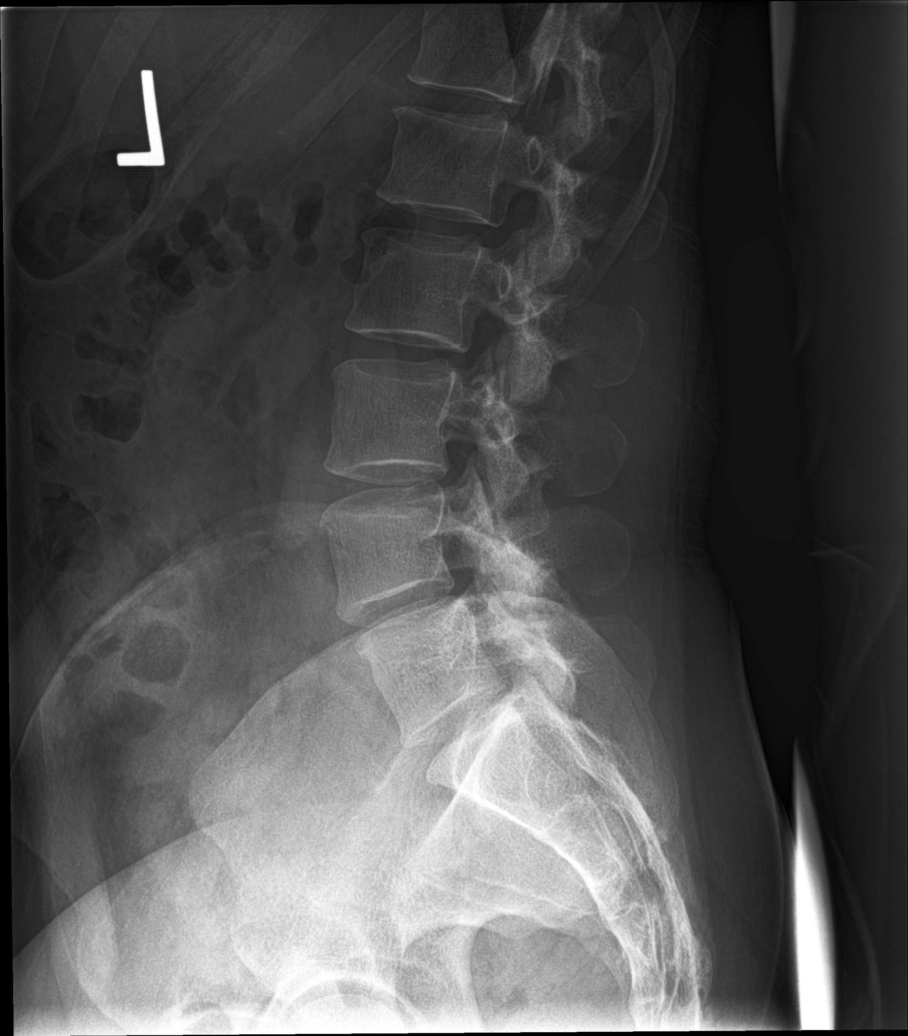

[5 of 5 positions shown; findings below may reference images not displayed]

FINDINGS: Hypoplastic last ribs.

5 additional non-rib-bearing lumbar type vertebra.

Osseous mineralization normal.

Disc space narrowing L4-L5.

Vertebral body and remaining disc space heights maintained.

No acute fracture, subluxation, or bone destruction.

SI joints preserved.

No spondylolysis.
IMPRESSION: Mild degenerative disc disease changes at L4-L5.

No acute abnormalities.

## 2019-10-01 ENCOUNTER — Other Ambulatory Visit: Payer: Self-pay

## 2019-10-01 DIAGNOSIS — F3341 Major depressive disorder, recurrent, in partial remission: Secondary | ICD-10-CM

## 2019-10-02 ENCOUNTER — Other Ambulatory Visit: Payer: Self-pay

## 2019-10-02 DIAGNOSIS — F3341 Major depressive disorder, recurrent, in partial remission: Secondary | ICD-10-CM

## 2019-10-02 MED ORDER — ALPRAZOLAM 0.5 MG PO TABS: ORAL_TABLET | ORAL | 0 refills | Status: DC

## 2019-10-27 ENCOUNTER — Other Ambulatory Visit: Payer: Self-pay | Admitting: Internal Medicine

## 2019-10-27 DIAGNOSIS — E039 Hypothyroidism, unspecified: Secondary | ICD-10-CM

## 2019-10-27 MED ORDER — LEVOTHYROXINE SODIUM 175 MCG PO TABS: ORAL_TABLET | ORAL | 0 refills | Status: DC

## 2020-01-17 ENCOUNTER — Other Ambulatory Visit: Payer: Self-pay

## 2020-01-17 DIAGNOSIS — F3341 Major depressive disorder, recurrent, in partial remission: Secondary | ICD-10-CM

## 2020-01-17 MED ORDER — ALPRAZOLAM 0.5 MG PO TABS: ORAL_TABLET | ORAL | 0 refills | Status: DC

## 2020-02-25 ENCOUNTER — Other Ambulatory Visit: Payer: Self-pay

## 2020-02-25 DIAGNOSIS — F3341 Major depressive disorder, recurrent, in partial remission: Secondary | ICD-10-CM

## 2020-02-25 MED ORDER — ALPRAZOLAM 0.5 MG PO TABS: ORAL_TABLET | ORAL | 0 refills | Status: DC

## 2020-02-26 ENCOUNTER — Other Ambulatory Visit: Payer: Self-pay | Admitting: Adult Health

## 2020-02-26 DIAGNOSIS — M545 Low back pain, unspecified: Secondary | ICD-10-CM

## 2020-02-26 DIAGNOSIS — G8929 Other chronic pain: Secondary | ICD-10-CM

## 2020-02-26 MED ORDER — IBUPROFEN 800 MG PO TABS: 800.0000 mg | ORAL_TABLET | Freq: Three times a day (TID) | ORAL | 1 refills | Status: DC | PRN

## 2020-03-03 ENCOUNTER — Other Ambulatory Visit: Payer: Self-pay | Admitting: Internal Medicine

## 2020-03-03 DIAGNOSIS — E039 Hypothyroidism, unspecified: Secondary | ICD-10-CM

## 2020-03-03 MED ORDER — LEVOTHYROXINE SODIUM 175 MCG PO TABS: ORAL_TABLET | ORAL | 0 refills | Status: DC

## 2020-03-06 DIAGNOSIS — F419 Anxiety disorder, unspecified: Secondary | ICD-10-CM | POA: Insufficient documentation

## 2020-03-06 NOTE — Progress Notes (Deleted)
Complete Physical  Assessment and Plan:  Routine general medical examination at a health care facility Follow up GYN ***  Hypothyroidism, unspecified type Hypothyroidism-check TSH level, continue medications the same, reminded to take on an empty stomach 30-76mns before food.  -     TSH  Recurrent major depressive disorder, in partial remission (HCC) -     sertraline (ZOLOFT) 100 MG tablet; Take 1 tablet daily.   Anxiety Discussed decreasing xanax use, use as needed *** Increase zoloft to 1075mDiscussed CBT therapy, given numbers and told to contact insurance for people on her list Follow up 3 months -     sertraline (ZOLOFT) 100 MG tablet; Take 1/2 to 1 tablet daily.  Vitamin D deficiency Continue supplement  Medication management -     CBC with Differential/Platelet -     CMP/GFR -     Magnesium  Screening cholesterol level -     Lipid panel  Screening for hematuria or proteinuria -     Urinalysis, Routine w reflex microscopic  No orders of the defined types were placed in this encounter.    Discussed med's effects and SE's. Screening labs and tests as requested with regular follow-up as recommended. Over 40 minutes of exam, counseling, chart review, and complex, high level critical decision making was performed this visit.  Future Appointments  Date Time Provider [place]12/13/2021 10:00 AM CoLiane ComberNP [facility] None    HPI  [age].o. female  presents for a complete physical and follow up for has Hypothyroid; Depression with anxiety; Vitamin D deficiency; and Medication management on their problem list..  She will take xanax occ during the day, increased to zoloft 10016mHer daughter is in school 3 days a week, son is 2 days a week, she is working from home.    BMI is There is no height or weight on file to calculate BMI., she {HAS HAS NOTZWC:58527}en working on diet and exercise. Wt Readings from Last 3 Encounters:  03/13/19 157 lb 9.6 oz  (71.5 kg)  11/12/18 166 lb 3.2 oz (75.4 kg)  06/12/18 168 lb 3.2 oz (76.3 kg)   Her blood pressure has been controlled at home, today their BP is   She does not workout. She denies chest pain, shortness of breath, dizziness.   She is not on cholesterol medication and denies myalgias. Her cholesterol is at goal. The cholesterol last visit was:   Lab Results  Component Value Date   CHOL 212 (H) 03/13/2019   HDL 52 03/13/2019   LDLCALC 134 (H) 03/13/2019   TRIG 131 03/13/2019   CHOLHDL 4.1 03/13/2019   . Last A1C in the office was:  Lab Results  Component Value Date   HGBA1C 5.3 03/13/2019    Last GFR:  Lab Results  Component Value Date   GFRNONAA 96 03/13/2019   Patient is on Vitamin D supplement.   Lab Results  Component Value Date   VD25OH 39 03/13/2019     She is on thyroid medication, taking it in the AM, waiting an hour. Her medication was *** changed last visit.  She is on 1 daily and did not increase it but trying to be better with when she takes it.  Lab Results  Component Value Date   TSH 0.30 (L) 03/13/2019  .    Lab Results  Component Value Date   WBC 6.5 03/13/2019   HGB 14.2 03/13/2019   HCT 41.7 03/13/2019   MCV 86.9 03/13/2019  PLT 315 03/13/2019   Lab Results  Component Value Date   IRON 78 10/26/2016   TIBC 398 10/26/2016   FERRITIN 39 02/04/2013   Lab Results  Component Value Date   VITAMINB12 360 10/26/2016    Calcium *** Lab Results  Component Value Date   NA 138 03/13/2019   K 4.5 03/13/2019   CL 100 03/13/2019   CO2 29 03/13/2019   GLUCOSE 84 03/13/2019   BUN 12 03/13/2019   CREATININE 0.77 03/13/2019   CALCIUM 10.3 (H) 03/13/2019   GFRAA 111 03/13/2019   GFRNONAA 96 03/13/2019     Current Medications:  Current Outpatient Medications on File Prior to Visit  Medication Sig Dispense Refill  . ALPRAZolam (XANAX) 0.5 MG tablet Take      1/2 to 1 tablet      1 to 2 x  /day      ONLY if needed for Anxiety Attack and please  try to limit to 5 days /week to avoid addiction 60 tablet 0  . Cholecalciferol (VITAMIN D3) 5000 units TABS Take 5,000 Units by mouth daily.    Marland Kitchen ibuprofen (ADVIL) 800 MG tablet Take 1 tablet (800 mg total) by mouth every 8 (eight) hours as needed. 60 tablet 1  . levonorgestrel-ethinyl estradiol (KURVELO) 0.15-30 MG-MCG tablet Take 1 tablet Daily 84 tablet 3  . levothyroxine (EUTHYROX) 175 MCG tablet Take 1 tablet daily on an empty stomach with only water for 30 minutes & no Antacid meds, Calcium or Magnesium for 4 hours & avoid Biotin 90 tablet 0  . sertraline (ZOLOFT) 100 MG tablet Take 1/2 to 1 tablet daily. 90 tablet 3   No current facility-administered medications on file prior to visit.   Allergies:  No Known Allergies   Medical History:  She has Hypothyroid; Depression with anxiety; Vitamin D deficiency; and Medication management on their problem list.   Health Maintenance:   Immunization History  Administered Date(s) Administered  . Influenza Inj Mdck Quad With Preservative 03/07/2018  . Influenza, Seasonal, Injecte, Preservative Fre 04/13/2016  . MMR 05/14/2014   Tetanus: *** 2-3 year ago Flu vaccine: 2019 *** Covid 19: ***  No LMP recorded. Pap: 2016, Need to schdule *** MGM: 2015 CAT D- need to schedule  Colonoscopy: discuss age 27  EGD:  Last eye:  Last dental:  Last derm:   Patient Care Team: Unk Pinto, MD as PCP - General (Internal Medicine) Dian Queen, MD as Consulting Physician (Obstetrics and Gynecology)  Surgical History:  She has a past surgical history that includes Heart chamber revision (1979). Family History:  Herfamily history includes Hyperlipidemia in her father and mother. Social History:  She reports that she quit smoking about 6 years ago. She has never used smokeless tobacco. She reports current alcohol use. She reports that she does not use drugs.  Review of Systems: Review of Systems  Constitutional: Negative for  malaise/fatigue and weight loss.  HENT: Negative for hearing loss and tinnitus.   Eyes: Negative for blurred vision and double vision.  Respiratory: Negative for cough, shortness of breath and wheezing.   Cardiovascular: Negative for chest pain, palpitations, orthopnea, claudication and leg swelling.  Gastrointestinal: Negative for abdominal pain, blood in stool, constipation, diarrhea, heartburn, melena, nausea and vomiting.  Genitourinary: Negative.   Musculoskeletal: Negative for joint pain and myalgias.  Skin: Negative for rash.  Neurological: Negative for dizziness, tingling, sensory change, weakness and headaches.  Endo/Heme/Allergies: Negative for polydipsia.  Psychiatric/Behavioral: Negative.   All other systems  reviewed and are negative.   Physical Exam: Estimated body mass index is 23.61 kg/m as calculated from the following:   Height as of 03/13/19: 5' 8.5" (1.74 m).   Weight as of 03/13/19: 157 lb 9.6 oz (71.5 kg). There were no vitals taken for this visit. General Appearance: Well nourished, in no apparent distress.  Eyes: PERRLA, EOMs, conjunctiva no swelling or erythema, normal fundi and vessels.  Sinuses: No Frontal/maxillary tenderness  ENT/Mouth: Ext aud canals clear, normal light reflex with TMs without erythema, bulging. Good dentition. No erythema, swelling, or exudate on post pharynx. Tonsils not swollen or erythematous. Hearing normal.  Neck: Supple, thyroid normal. No bruits  Respiratory: Respiratory effort normal, BS equal bilaterally without rales, rhonchi, wheezing or stridor.  Cardio: RRR without murmurs, rubs or gallops. Brisk peripheral pulses without edema.  Chest: symmetric, with normal excursions and percussion.  Breasts: defer Abdomen: Soft, nontender, no guarding, rebound, hernias, masses, or organomegaly.  Lymphatics: Non tender without lymphadenopathy.  Genitourinary: defer Musculoskeletal: Full ROM all peripheral extremities,5/5 strength, and  normal gait.  Skin: Warm, dry without rashes, lesions, ecchymosis. Neuro: Cranial nerves intact, reflexes equal bilaterally. Normal muscle tone, no cerebellar symptoms. Sensation intact.  Psych: Awake and oriented X 3, normal affect, Insight and Judgment appropriate.   EKG: last in 2016 WNL ***  Alyssa Skinner Alyssa Skinner 1:09 PM [facility]

## 2020-03-16 ENCOUNTER — Encounter: Payer: 59 | Admitting: Adult Health

## 2020-04-09 ENCOUNTER — Other Ambulatory Visit: Payer: Self-pay

## 2020-04-09 ENCOUNTER — Other Ambulatory Visit: Payer: Self-pay | Admitting: Internal Medicine

## 2020-04-09 DIAGNOSIS — F3341 Major depressive disorder, recurrent, in partial remission: Secondary | ICD-10-CM

## 2020-04-10 MED ORDER — ALPRAZOLAM 0.5 MG PO TABS: ORAL_TABLET | ORAL | 0 refills | Status: DC

## 2020-04-14 ENCOUNTER — Ambulatory Visit (INDEPENDENT_AMBULATORY_CARE_PROVIDER_SITE_OTHER): Payer: 59 | Admitting: Adult Health

## 2020-04-14 ENCOUNTER — Other Ambulatory Visit: Payer: Self-pay

## 2020-04-14 ENCOUNTER — Encounter: Payer: Self-pay | Admitting: Adult Health

## 2020-04-14 VITALS — BP 120/72 | HR 75 | Temp 97.7°F | Ht 68.0 in | Wt 157.0 lb

## 2020-04-14 DIAGNOSIS — F419 Anxiety disorder, unspecified: Secondary | ICD-10-CM

## 2020-04-14 DIAGNOSIS — E782 Mixed hyperlipidemia: Secondary | ICD-10-CM

## 2020-04-14 DIAGNOSIS — Z20822 Contact with and (suspected) exposure to covid-19: Secondary | ICD-10-CM

## 2020-04-14 DIAGNOSIS — Z1389 Encounter for screening for other disorder: Secondary | ICD-10-CM

## 2020-04-14 DIAGNOSIS — F3341 Major depressive disorder, recurrent, in partial remission: Secondary | ICD-10-CM

## 2020-04-14 DIAGNOSIS — Z833 Family history of diabetes mellitus: Secondary | ICD-10-CM

## 2020-04-14 DIAGNOSIS — E538 Deficiency of other specified B group vitamins: Secondary | ICD-10-CM

## 2020-04-14 DIAGNOSIS — E559 Vitamin D deficiency, unspecified: Secondary | ICD-10-CM

## 2020-04-14 DIAGNOSIS — Z131 Encounter for screening for diabetes mellitus: Secondary | ICD-10-CM

## 2020-04-14 DIAGNOSIS — Z79899 Other long term (current) drug therapy: Secondary | ICD-10-CM

## 2020-04-14 DIAGNOSIS — F17210 Nicotine dependence, cigarettes, uncomplicated: Secondary | ICD-10-CM

## 2020-04-14 DIAGNOSIS — E039 Hypothyroidism, unspecified: Secondary | ICD-10-CM

## 2020-04-14 DIAGNOSIS — Z0000 Encounter for general adult medical examination without abnormal findings: Secondary | ICD-10-CM | POA: Diagnosis not present

## 2020-04-14 DIAGNOSIS — Z6823 Body mass index (BMI) 23.0-23.9, adult: Secondary | ICD-10-CM

## 2020-04-14 NOTE — Progress Notes (Signed)
Complete Physical  Assessment and Plan:  Routine general medical examination at a health care facility Follow up GYN for PAP, mammogram   Hypothyroidism, unspecified type Hypothyroidism-check TSH level, continue medications the same, reminded to take on an empty stomach 30-8mns before food.  -     TSH  Recurrent major depressive disorder, in partial remission (HCC) Lifestyle discussed: diet/exerise, sleep hygiene, stress management, hydration -     sertraline (ZOLOFT) 100 MG tablet; Take 1 tablet daily.   Anxiety Well managed by current regimen; continue medications Stress management techniques discussed, increase water, good sleep hygiene discussed, increase exercise, and increase veggies.   Vitamin D deficiency Continue supplement  Medication management -     CBC with Differential/Platelet -     CMP/GFR -     Magnesium  Hyperlipidemia Mild/mod, low risk history, working on lifestyle  Continue low cholesterol diet and exercise.  Check lipid panel.  -     Lipid panel  Screening for hematuria or proteinuria -     Urinalysis, Routine w reflex microscopic  Vitamin B12 def       -      Vitamin B12  Current light smoker Discussed risks associated with tobacco use and advised to reduce or quit Patient is not ready to do so, but advised to consider strongly; resources given Will follow up at the next visit   Orders Placed This Encounter  Procedures  . SARS-CoV-2 Antibody(IgG)Spike,Semi-Quantitative  . CBC with Differential/Platelet  . COMPLETE METABOLIC PANEL WITH GFR  . Magnesium  . Lipid panel  . TSH  . Hemoglobin A1c  . VITAMIN D 25 Hydroxy (Vit-D Deficiency, Fractures)  . Urinalysis, Routine w reflex microscopic  . Vitamin B12     Discussed med's effects and SE's. Screening labs and tests as requested with regular follow-up as recommended. Over 40 minutes of exam, counseling, chart review, and complex, high level critical decision making was performed this  visit.  Future Appointments  Date Time Provider [place] 10/14/2020  3:30 PM CLiane Comber NP [facility] None  04/14/2021  3:00 PM CLiane Comber NP [facility] None    HPI  45y.o. female  presents for a complete physical and follow up for has Hypothyroid; Recurrent major depression in partial remission ([place]; Vitamin D deficiency; Medication management; Anxiety; and Light cigarette smoker (1-9 cigarettes per day) on their problem list.   She is married, 2 kids - 147y/o boy and 512y/o girl - doing well. Works from home, [employer] [facility] her job.   She is followed by Dr. GHazle Coca- at [facility], seeing tomorrow, has mammogram scheduled, doing 3d for dense breasts.   She has hx of depression and anxiety, taking sertraline 50 mg daily working well, will take xanax occ, estimates 1-2 times per week.   She is very light intermittent smoker, 2-3 cigs currently, smoking intermittent since she was 16. Discussed risks associated with smoking, patient is not ready to quit.     BMI is Body mass index is 23.87 kg/m., she has not been working on diet and exercise, makes some good choices but knows she needs to do better, trying to increase veggies. Will occasionally walk in neighborhood, with warmer weather will walk with youngest.  Water: 1-2 glasses/day, some soda (sips throughout the day), some coffee  Wt Readings from Last 3 Encounters:  04/14/20 157 lb (71.2 kg)  03/13/19 157 lb 9.6 oz (71.5 kg)  11/12/18 166 lb 3.2 oz (75.4 kg)  Her blood pressure has been controlled at home, today their BP is BP: 120/72 She does not workout. She denies chest pain, shortness of breath, dizziness.   She is not on cholesterol medication and denies myalgias, no CVA/MI family history. Her cholesterol is at goal. The cholesterol last visit was:   Lab Results  Component Value Date   CHOL 212 (H) 03/13/2019   HDL 52 03/13/2019   LDLCALC 134 (H) 03/13/2019   TRIG 131 03/13/2019   CHOLHDL 4.1  03/13/2019   . Last A1C in the office was:   Lab Results  Component Value Date   HGBA1C 5.3 03/13/2019    Last GFR:  Lab Results  Component Value Date   GFRNONAA 96 03/13/2019   Patient is on Vitamin D supplement, taking 5000 IU most days  Lab Results  Component Value Date   VD25OH 39 03/13/2019     She is on thyroid medication, taking it in the AM, waiting an hour. Her medication was not changed last visit. She is on 1 daily, tries to remember to take 30 min prior to coffee Lab Results  Component Value Date   TSH 0.30 (L) 03/13/2019   She is not currently on supplement for B12  Lab Results  Component Value Date   VITAMINB12 360 10/26/2016     Current Medications:  Current Outpatient Medications on File Prior to Visit  Medication Sig Dispense Refill  . ALPRAZolam (XANAX) 0.5 MG tablet Take      1/2 to 1 tablet      1 to 2 x  /day      ONLY if needed for Anxiety Attack and please try to limit to 5 days /week to avoid addiction 60 tablet 0  . Cholecalciferol (VITAMIN D3) 5000 units TABS Take 5,000 Units by mouth daily.    Marland Kitchen ibuprofen (ADVIL) 800 MG tablet Take 1 tablet (800 mg total) by mouth every 8 (eight) hours as needed. 60 tablet 1  . levonorgestrel-ethinyl estradiol (KURVELO) 0.15-30 MG-MCG tablet Take 1 tablet Daily 84 tablet 3  . levothyroxine (EUTHYROX) 175 MCG tablet Take 1 tablet daily on an empty stomach with only water for 30 minutes & no Antacid meds, Calcium or Magnesium for 4 hours & avoid Biotin 90 tablet 0  . sertraline (ZOLOFT) 100 MG tablet Take 1/2 to 1 tablet daily. 90 tablet 3   No current facility-administered medications on file prior to visit.   Allergies:  No Known Allergies   Medical History:  She has Hypothyroid; Recurrent major depression in partial remission ([place]); Vitamin D deficiency; Medication management; Anxiety; and Light cigarette smoker (1-9 cigarettes per day) on their problem list.   Health Maintenance:   Immunization History   Administered Date(s) Administered  . Influenza Inj Mdck Quad With Preservative 03/07/2018  . Influenza, Seasonal, Injecte, Preservative Fre 04/13/2016  . Influenza-Unspecified 05/31/2017  . MMR 05/14/2014   Tetanus: 01/2014 Flu vaccine: 2019 declines  Covid 19: declines   No LMP recorded. Pap: 2016, PAP tomorrow at GYN MGM: 2015 CAT D- 0- getting annuallly at GYN, scheduled tomorrow - report requested  Colonoscopy: discuss age 50  EGD:  Last eye: remote, no issues Last dental: last several years, encouraged to schedule  Last derm: never   Patient Care Team: Unk Pinto, MD as PCP - General (Internal Medicine) Dian Queen, MD as Consulting Physician (Obstetrics and Gynecology)  Surgical History:  She has a past surgical history that includes Heart chamber revision (1979) and Wisdom tooth extraction (  1999). Family History:  Herfamily history includes COPD in her paternal grandmother; Diabetes type II in her maternal aunt; Hyperlipidemia in her father and mother. Social History:  She reports that she has been smoking. She started smoking about 29 years ago. She has been smoking about 0.20 packs per day. She has never used smokeless tobacco. She reports current alcohol use of about 7.0 standard drinks of alcohol per week. She reports that she does not use drugs.  Review of Systems: Review of Systems  Constitutional: Negative for malaise/fatigue and weight loss.  HENT: Negative for hearing loss and tinnitus.   Eyes: Negative for blurred vision and double vision.  Respiratory: Negative for cough, shortness of breath and wheezing.   Cardiovascular: Negative for chest pain, palpitations, orthopnea, claudication and leg swelling.  Gastrointestinal: Negative for abdominal pain, blood in stool, constipation, diarrhea, heartburn, melena, nausea and vomiting.  Genitourinary: Negative.   Musculoskeletal: Negative for joint pain and myalgias.  Skin: Negative for rash.   Neurological: Negative for dizziness, tingling, sensory change, weakness and headaches.  Endo/Heme/Allergies: Negative for polydipsia.  Psychiatric/Behavioral: Negative.   All other systems reviewed and are negative.   Physical Exam: Estimated body mass index is 23.87 kg/m as calculated from the following:   Height as of this encounter: '5\' 8"'  (1.727 m).   Weight as of this encounter: 157 lb (71.2 kg). BP 120/72   Pulse 75   Temp 97.7 F (36.5 C)   Ht '5\' 8"'  (1.727 m)   Wt 157 lb (71.2 kg)   SpO2 99%   BMI 23.87 kg/m  General Appearance: Well nourished, in no apparent distress.  Eyes: PERRLA, EOMs, conjunctiva no swelling or erythema, normal fundi and vessels.  Sinuses: No Frontal/maxillary tenderness  ENT/Mouth: Ext aud canals clear, normal light reflex with TMs without erythema, bulging. Good dentition. No erythema, swelling, or exudate on post pharynx. Tonsils not swollen or erythematous. Hearing normal.  Neck: Supple, thyroid normal. No bruits  Respiratory: Respiratory effort normal, BS equal bilaterally without rales, rhonchi, wheezing or stridor.  Cardio: RRR without murmurs, rubs or gallops. Brisk peripheral pulses without edema.  Chest: symmetric, with normal excursions and percussion.  Breasts: defer Abdomen: Soft, nontender, no guarding, rebound, hernias, masses, or organomegaly.  Lymphatics: Non tender without lymphadenopathy.  Genitourinary: defer Musculoskeletal: Full ROM all peripheral extremities,5/5 strength, and normal gait.  Skin: Warm, dry without rashes, lesions, ecchymosis. Neuro: Cranial nerves intact, reflexes equal bilaterally. Normal muscle tone, no cerebellar symptoms. Sensation intact.  Psych: Awake and oriented X 3, normal affect, Insight and Judgment appropriate.   EKG: last in 2016 WNL, no concerns, defer  Izora Ribas 5:25 PM [facility]

## 2020-04-14 NOTE — Patient Instructions (Signed)
  Ms. Lazenby , Thank you for taking time to come for your Annual Wellness Visit. I appreciate your ongoing commitment to your health goals. Please review the following plan we discussed and let me know if I can assist you in the future.   These are the goals we discussed: Goals    . DIET - INCREASE WATER INTAKE    . Quit Smoking       This is a list of the screening recommended for you and due dates:  Health Maintenance  Topic Date Due  . Pap Smear  06/22/2017  . COVID-19 Vaccine (1) 04/30/2020*  . Flu Shot  07/02/2020*  . Tetanus Vaccine  01/09/2024  .  Hepatitis C: One time screening is recommended by Center for Disease Control  (CDC) for  adults born from [birth year] through 1963.   Completed  . HIV Screening  Completed  *Topic was postponed. The date shown is not the original due date.    Know what a healthy weight is for you (roughly BMI <25) and aim to maintain this  Aim for 7+ servings of fruits and vegetables daily  65-80+ fluid ounces of water or unsweet tea for healthy kidneys  Limit to max 1 drink of alcohol per day; avoid smoking/tobacco  Limit animal fats in diet for cholesterol and heart health - choose grass fed whenever available  Avoid highly processed foods, and foods high in saturated/trans fats  Aim for low stress - take time to unwind and care for your mental health  Aim for 150 min of moderate intensity exercise weekly for heart health, and weights twice weekly for bone health  Aim for 7-9 hours of sleep daily      SMOKING CESSATION  American cancer society  51884166063 for more information or for a free program for smoking cessation help.   You can call QUIT SMART 1-800-QUIT-NOW for free nicotine patches or replacement therapy- if they are out- keep calling  [facility] Can call for smoking cessation classes, [phone]  If you have a smart phone, please look up Smoke Free app, this will help you stay on track and give you  information about money you have saved, life that you have gained back and a ton of more information.     ADVANTAGES OF QUITTING SMOKING  Within 20 minutes, blood pressure decreases. Your pulse is at normal level.  After 8 hours, carbon monoxide levels in the blood return to normal. Your oxygen level increases.  After 24 hours, the chance of having a heart attack starts to decrease. Your breath, hair, and body stop smelling like smoke.  After 48 hours, damaged nerve endings begin to recover. Your sense of taste and smell improve.  After 72 hours, the body is virtually free of nicotine. Your bronchial tubes relax and breathing becomes easier.  After 2 to 12 weeks, lungs can hold more air. Exercise becomes easier and circulation improves.  After 1 year, the risk of coronary heart disease is cut in half.  After 5 years, the risk of stroke falls to the same as a nonsmoker.  After 10 years, the risk of lung cancer is cut in half and the risk of other cancers decreases significantly.  After 15 years, the risk of coronary heart disease drops, usually to the level of a nonsmoker.  You will have extra money to spend on things other than cigarettes.

## 2020-04-15 ENCOUNTER — Other Ambulatory Visit: Payer: Self-pay | Admitting: Adult Health

## 2020-04-15 DIAGNOSIS — E039 Hypothyroidism, unspecified: Secondary | ICD-10-CM

## 2020-04-15 DIAGNOSIS — E538 Deficiency of other specified B group vitamins: Secondary | ICD-10-CM | POA: Insufficient documentation

## 2020-04-16 LAB — TSH: TSH: 4.92 mIU/L — ABNORMAL HIGH

## 2020-04-16 LAB — URINALYSIS, ROUTINE W REFLEX MICROSCOPIC
Bilirubin Urine: NEGATIVE
Glucose, UA: NEGATIVE
Hgb urine dipstick: NEGATIVE
Ketones, ur: NEGATIVE
Leukocytes,Ua: NEGATIVE
Nitrite: NEGATIVE
Protein, ur: NEGATIVE
Specific Gravity, Urine: 1.021 (ref 1.001–1.03)
pH: 7 (ref 5.0–8.0)

## 2020-04-16 LAB — LIPID PANEL
Cholesterol: 191 mg/dL (ref <200)
HDL: 52 mg/dL (ref >=50)
LDL Cholesterol (Calc): 110 mg/dL (calc) — ABNORMAL HIGH
Non-HDL Cholesterol (Calc): 139 mg/dL (calc) — ABNORMAL HIGH (ref <130)
Total CHOL/HDL Ratio: 3.7 (calc) (ref <5.0)
Triglycerides: 176 mg/dL — ABNORMAL HIGH (ref <150)

## 2020-04-16 LAB — COMPLETE METABOLIC PANEL WITH GFR
AG Ratio: 1.8 (calc) (ref 1.0–2.5)
ALT: 10 U/L (ref 6–29)
AST: 11 U/L (ref 10–30)
Albumin: 4.1 g/dL (ref 3.6–5.1)
Alkaline phosphatase (APISO): 49 U/L (ref 31–125)
BUN: 13 mg/dL (ref 7–25)
CO2: 26 mmol/L (ref 20–32)
Calcium: 9.6 mg/dL (ref 8.6–10.2)
Chloride: 104 mmol/L (ref 98–110)
Creat: 0.89 mg/dL (ref 0.50–1.10)
GFR, Est African American: 92 mL/min/{1.73_m2} (ref >=60)
GFR, Est Non African American: 79 mL/min/{1.73_m2} (ref >=60)
Globulin: 2.3 g/dL (calc) (ref 1.9–3.7)
Glucose, Bld: 86 mg/dL (ref 65–99)
Potassium: 4.2 mmol/L (ref 3.5–5.3)
Sodium: 139 mmol/L (ref 135–146)
Total Bilirubin: 0.4 mg/dL (ref 0.2–1.2)
Total Protein: 6.4 g/dL (ref 6.1–8.1)

## 2020-04-16 LAB — HEMOGLOBIN A1C
Hgb A1c MFr Bld: 5.1 % of total Hgb (ref <5.7)
Mean Plasma Glucose: 100 mg/dL
eAG (mmol/L): 5.5 mmol/L

## 2020-04-16 LAB — CBC WITH DIFFERENTIAL/PLATELET
Absolute Monocytes: 449 cells/uL (ref 200–950)
Basophils Absolute: 47 cells/uL (ref 0–200)
Basophils Relative: 0.7 %
Eosinophils Absolute: 369 cells/uL (ref 15–500)
Eosinophils Relative: 5.5 %
HCT: 38.2 % (ref 35.0–45.0)
Hemoglobin: 13 g/dL (ref 11.7–15.5)
Lymphs Abs: 2077 cells/uL (ref 850–3900)
MCH: 30 pg (ref 27.0–33.0)
MCHC: 34 g/dL (ref 32.0–36.0)
MCV: 88.2 fL (ref 80.0–100.0)
MPV: 11.3 fL (ref 7.5–12.5)
Monocytes Relative: 6.7 %
Neutro Abs: 3759 cells/uL (ref 1500–7800)
Neutrophils Relative %: 56.1 %
Platelets: 288 10*3/uL (ref 140–400)
RBC: 4.33 10*6/uL (ref 3.80–5.10)
RDW: 12.4 % (ref 11.0–15.0)
Total Lymphocyte: 31 %
WBC: 6.7 10*3/uL (ref 3.8–10.8)

## 2020-04-16 LAB — VITAMIN D 25 HYDROXY (VIT D DEFICIENCY, FRACTURES): Vit D, 25-Hydroxy: 33 ng/mL (ref 30–100)

## 2020-04-16 LAB — MAGNESIUM: Magnesium: 1.8 mg/dL (ref 1.5–2.5)

## 2020-04-16 LAB — VITAMIN B12: Vitamin B-12: 275 pg/mL (ref 200–1100)

## 2020-04-16 LAB — SARS-COV-2 ANTIBODY(IGG)SPIKE,SEMI-QUANTITATIVE: SARS COV1 AB(IGG)SPIKE,SEMI QN: <1 index (ref <1.00)

## 2020-04-21 ENCOUNTER — Other Ambulatory Visit: Payer: Self-pay | Admitting: Internal Medicine

## 2020-04-21 DIAGNOSIS — E039 Hypothyroidism, unspecified: Secondary | ICD-10-CM

## 2020-04-21 MED ORDER — LEVOTHYROXINE SODIUM 175 MCG PO TABS
ORAL_TABLET | ORAL | 3 refills | Status: DC
Start: 2020-04-21 — End: 2021-04-24

## 2020-06-03 LAB — RESULTS CONSOLE HPV: CHL HPV: NEGATIVE

## 2020-06-03 LAB — HM MAMMOGRAPHY

## 2020-06-03 LAB — HM PAP SMEAR: HM Pap smear: NEGATIVE

## 2020-06-05 ENCOUNTER — Other Ambulatory Visit: Payer: Self-pay

## 2020-06-05 DIAGNOSIS — F3341 Major depressive disorder, recurrent, in partial remission: Secondary | ICD-10-CM

## 2020-06-05 DIAGNOSIS — F419 Anxiety disorder, unspecified: Secondary | ICD-10-CM

## 2020-06-05 MED ORDER — ALPRAZOLAM 0.5 MG PO TABS: ORAL_TABLET | ORAL | 0 refills | Status: DC

## 2020-06-05 MED ORDER — SERTRALINE HCL 100 MG PO TABS: ORAL_TABLET | ORAL | 3 refills | Status: DC

## 2020-07-15 ENCOUNTER — Other Ambulatory Visit: Payer: Self-pay | Admitting: Adult Health

## 2020-07-15 DIAGNOSIS — F3341 Major depressive disorder, recurrent, in partial remission: Secondary | ICD-10-CM

## 2020-07-15 MED ORDER — LEVONORGESTREL-ETHINYL ESTRAD 0.15-30 MG-MCG PO TABS: ORAL_TABLET | ORAL | 3 refills | Status: DC

## 2020-08-14 ENCOUNTER — Other Ambulatory Visit: Payer: Self-pay

## 2020-08-14 DIAGNOSIS — F3341 Major depressive disorder, recurrent, in partial remission: Secondary | ICD-10-CM

## 2020-08-14 MED ORDER — ALPRAZOLAM 0.5 MG PO TABS
ORAL_TABLET | ORAL | 0 refills | Status: DC
Start: 2020-08-14 — End: 2020-10-17

## 2020-10-14 ENCOUNTER — Ambulatory Visit: Payer: 59 | Admitting: Adult Health

## 2020-10-14 DIAGNOSIS — E785 Hyperlipidemia, unspecified: Secondary | ICD-10-CM | POA: Insufficient documentation

## 2020-10-14 NOTE — Progress Notes (Deleted)
6 MONTH FOLLOW UP  Assessment and Plan:   Hypothyroidism, unspecified type Hypothyroidism-check TSH level, continue medications the same, reminded to take on an empty stomach 30-38mins before food.  -     TSH  Recurrent major depressive disorder, in partial remission (HCC) Lifestyle discussed: diet/exerise, sleep hygiene, stress management, hydration   Anxiety Well managed by current regimen; continue medications Stress management techniques discussed, increase water, good sleep hygiene discussed, increase exercise, and increase veggies.   Vitamin D deficiency Continue supplement  Medication management -     CBC with Differential/Platelet -     CMP/GFR -     Magnesium  Hyperlipidemia Mild/mod, low risk history, working on lifestyle  Continue low cholesterol diet and exercise.  Check lipid panel.  -     Lipid panel  Vitamin B12 def     ***   -      Vitamin B12  Current light smoker Discussed risks associated with tobacco use and advised to reduce or quit Patient is not ready to do so, but advised to consider strongly; resources given Will follow up at the next visit   No orders of the defined types were placed in this encounter.    Discussed med's effects and SE's. Labs and tests as requested with regular follow-up as recommended. Over 30 minutes of exam, counseling, chart review, and complex critical decision making was performed this visit.  Future Appointments  Date Time Provider Department Center  10/14/2020  3:30 PM Judd Gaudier, NP [facility] None  04/14/2021  3:00 PM Judd Gaudier, NP [facility] None    HPI  45 y.o. female  presents for 6 month follow up. She has Hypothyroid; Recurrent major depression in partial remission (HCC); Vitamin D deficiency; Medication management; Anxiety; Light cigarette smoker (1-9 cigarettes per day); and B12 deficiency on their problem list.     She has hx of depression and anxiety, taking sertraline 50 mg daily working  well. She is also prescribed xanax 0.5 mg tabs, BID PRN. Per PDMP 4 refills of #60 since Jan 2022 *** will take xanax occ, estimates 1-2 times per week.   She is very light intermittent smoker, 2-3 cigs currently, smoking intermittent since she was 16. Discussed risks associated with smoking, patient is not ready to quit.   BMI is There is no height or weight on file to calculate BMI., she has not been working on diet and exercise, makes some good choices but knows she needs to do better, trying to increase veggies. Will occasionally walk in neighborhood, with warmer weather will walk with youngest.  Water: 1-2 glasses/day, some soda (sips throughout the day), some coffee  Wt Readings from Last 3 Encounters:  04/14/20 157 lb (71.2 kg)  03/13/19 157 lb 9.6 oz (71.5 kg)  11/12/18 166 lb 3.2 oz (75.4 kg)   Her blood pressure has been controlled at home, today their BP is   She does not workout. She denies chest pain, shortness of breath, dizziness.   She is not on cholesterol medication and denies myalgias, no CVA/MI family history. Her cholesterol is not at goal. The cholesterol last visit was:   Lab Results  Component Value Date   CHOL 191 04/14/2020   HDL 52 04/14/2020   LDLCALC 110 (H) 04/14/2020   TRIG 176 (H) 04/14/2020   CHOLHDL 3.7 04/14/2020   . Last A1C in the office was:   Lab Results  Component Value Date   HGBA1C 5.1 04/14/2020    Last GFR:  Lab Results  Component Value Date   GFRNONAA 79 04/14/2020   Patient is on Vitamin D supplement, taking 5000 IU most days  Lab Results  Component Value Date   VD25OH 15 04/14/2020     She is on thyroid medication, taking it in the AM, waiting an hour. Her medication was not changed last visit. She is on 1 daily, tries to remember to take 30 min prior to coffee. She was recommended 1 month lab only that was never completed *** Lab Results  Component Value Date   TSH 4.92 (H) 04/14/2020   She is not currently on supplement for  B12 *** Lab Results  Component Value Date   VITAMINB12 275 04/14/2020     Current Medications:  Current Outpatient Medications on File Prior to Visit  Medication Sig Dispense Refill   ALPRAZolam (XANAX) 0.5 MG tablet TAKE 1/2 TO 1 TABLET ONE TO TWO TIMES PER DAY ONLY IF NEEDED FOR ANXIETY ATTACK AND TRY TO LIMIT TO 5 DAYS PER WEEK TO AVOID ADDICTION. 60 tablet 0   Cholecalciferol (VITAMIN D3) 5000 units TABS Take 5,000 Units by mouth daily.     ibuprofen (ADVIL) 800 MG tablet Take 1 tablet (800 mg total) by mouth every 8 (eight) hours as needed. 60 tablet 1   levonorgestrel-ethinyl estradiol (KURVELO) 0.15-30 MG-MCG tablet Take 1 tablet Daily 84 tablet 3   levothyroxine (EUTHYROX) 175 MCG tablet Take 1 tablet daily on an empty stomach with only water for 30 minutes & no Antacid meds, Calcium or Magnesium for 4 hours & avoid Biotin 90 tablet 3   sertraline (ZOLOFT) 100 MG tablet Take 1/2 to 1 tablet daily. 90 tablet 3   No current facility-administered medications on file prior to visit.   Allergies:  No Known Allergies   Medical History:  She has Hypothyroid; Recurrent major depression in partial remission (HCC); Vitamin D deficiency; Medication management; Anxiety; Light cigarette smoker (1-9 cigarettes per day); and B12 deficiency on their problem list.   Surgical History:  She has a past surgical history that includes Heart chamber revision (1979) and Wisdom tooth extraction (1999). Family History:  Herfamily history includes COPD in her paternal grandmother; Diabetes type II in her maternal aunt; Hyperlipidemia in her father and mother. Social History:  She reports that she has been smoking. She started smoking about 29 years ago. She has been smoking an average of 0.20 packs per day. She has never used smokeless tobacco. She reports current alcohol use of about 7.0 standard drinks of alcohol per week. She reports that she does not use drugs.  Review of Systems: Review of Systems   Constitutional:  Negative for malaise/fatigue and weight loss.  HENT:  Negative for hearing loss and tinnitus.   Eyes:  Negative for blurred vision and double vision.  Respiratory:  Negative for cough, shortness of breath and wheezing.   Cardiovascular:  Negative for chest pain, palpitations, orthopnea, claudication and leg swelling.  Gastrointestinal:  Negative for abdominal pain, blood in stool, constipation, diarrhea, heartburn, melena, nausea and vomiting.  Genitourinary: Negative.   Musculoskeletal:  Negative for joint pain and myalgias.  Skin:  Negative for rash.  Neurological:  Negative for dizziness, tingling, sensory change, weakness and headaches.  Endo/Heme/Allergies:  Negative for polydipsia.  Psychiatric/Behavioral: Negative.    All other systems reviewed and are negative.  Physical Exam: Estimated body mass index is 23.87 kg/m as calculated from the following:   Height as of 04/14/20: 5\' 8"  (1.727 m).  Weight as of 04/14/20: 157 lb (71.2 kg). There were no vitals taken for this visit. General Appearance: Well nourished, in no apparent distress.  Eyes: PERRLA, EOMs, conjunctiva no swelling or erythema, normal fundi and vessels.  Sinuses: No Frontal/maxillary tenderness  ENT/Mouth: Ext aud canals clear, normal light reflex with TMs without erythema, bulging. Good dentition. No erythema, swelling, or exudate on post pharynx. Tonsils not swollen or erythematous. Hearing normal.  Neck: Supple, thyroid normal. No bruits  Respiratory: Respiratory effort normal, BS equal bilaterally without rales, rhonchi, wheezing or stridor.  Cardio: RRR without murmurs, rubs or gallops. Brisk peripheral pulses without edema.  Chest: symmetric, with normal excursions and percussion.  Breasts: defer Abdomen: Soft, nontender, no guarding, rebound, hernias, masses, or organomegaly.  Lymphatics: Non tender without lymphadenopathy.  Genitourinary: defer Musculoskeletal: Full ROM all peripheral  extremities,5/5 strength, and normal gait.  Skin: Warm, dry without rashes, lesions, ecchymosis. Neuro: Cranial nerves intact, reflexes equal bilaterally. Normal muscle tone, no cerebellar symptoms. Sensation intact.  Psych: Awake and oriented X 3, normal affect, Insight and Judgment appropriate.    Dan Maker, NP 8:05 AM [facility]

## 2020-10-17 ENCOUNTER — Other Ambulatory Visit: Payer: Self-pay

## 2020-10-17 DIAGNOSIS — F3341 Major depressive disorder, recurrent, in partial remission: Secondary | ICD-10-CM

## 2020-10-17 MED ORDER — ALPRAZOLAM 0.5 MG PO TABS: ORAL_TABLET | ORAL | 0 refills | Status: DC

## 2020-10-28 ENCOUNTER — Ambulatory Visit: Payer: 59 | Admitting: Nurse Practitioner

## 2020-11-04 NOTE — Progress Notes (Deleted)
FOLLOW UP  Assessment and Plan:   Depression / Anxiety Stressed importance of limiting Xanax to 5 days a week Continue Zoloft 100 mg qd Stress importance of diet and exercise in controlling symptoms  Cholesterol Not at goal;  Continue low cholesterol diet and exercise.  Check lipid panel.   Hypothyroidism Continue current level of Levothyroxine until TSH level is reevaluated Stressed importance of taking on an empty stomach at least 30 minutes prior to other medication and food Check TSH level  Vitamin D Def Was not at goal at last visit; Stress importance of supplementation to maintain goal of 60-100 Check Vit D level  Medication Management Check CBC Check CMP    Continue diet and meds as discussed. Further disposition pending results of labs. Discussed med's effects and SE's.   Over 30 minutes of exam, counseling, chart review, and critical decision making was performed.   Future Appointments  Date Time Provider Department Center  11/05/2020 10:30 AM Revonda Humphrey, NP [facility] None  04/14/2021  3:00 PM Judd Gaudier, NP [facility] None    ----------------------------------------------------------------------------------------------------------------------  HPI 45 y.o. female  presents for 3 month follow up on hypothyroidism, cholesterol, depression/anxiety and vitamin D deficiency.   She is currently on Levothyroxine 175 mcg QD for hypothyroidism.  Her last TSH was slightly elevated Lab Results  Component Value Date   TSH 4.92 (H) 04/14/2020        BMI is There is no height or weight on file to calculate BMI., she {HAS HAS ZPH:15056} been working on diet and exercise. Wt Readings from Last 3 Encounters:  04/14/20 157 lb (71.2 kg)  03/13/19 157 lb 9.6 oz (71.5 kg)  11/12/18 166 lb 3.2 oz (75.4 kg)    Pt states her anxiety and depression have been well controlled on Zoloft 100 mg QD and Xanax 0.5mg  1/2 tab 1-2 times a day limited to 5 days a week***    She is not on cholesterol medication  Her cholesterol is not at goal. The cholesterol last visit was:   Lab Results  Component Value Date   CHOL 191 04/14/2020   HDL 52 04/14/2020   LDLCALC 110 (H) 04/14/2020   TRIG 176 (H) 04/14/2020   CHOLHDL 3.7 04/14/2020    Patient is on Vitamin D supplement.   Lab Results  Component Value Date   VD25OH 33 04/14/2020        Current Medications:  Current Outpatient Medications on File Prior to Visit  Medication Sig   ALPRAZolam (XANAX) 0.5 MG tablet TAKE 1/2 TO 1 TABLET ONE TO TWO TIMES PER DAY ONLY IF NEEDED FOR ANXIETY ATTACK AND TRY TO LIMIT TO 5 DAYS PER WEEK TO AVOID ADDICTION.   Cholecalciferol (VITAMIN D3) 5000 units TABS Take 5,000 Units by mouth daily.   ibuprofen (ADVIL) 800 MG tablet Take 1 tablet (800 mg total) by mouth every 8 (eight) hours as needed.   levonorgestrel-ethinyl estradiol (KURVELO) 0.15-30 MG-MCG tablet Take 1 tablet Daily   levothyroxine (EUTHYROX) 175 MCG tablet Take 1 tablet daily on an empty stomach with only water for 30 minutes & no Antacid meds, Calcium or Magnesium for 4 hours & avoid Biotin   sertraline (ZOLOFT) 100 MG tablet Take 1/2 to 1 tablet daily.   No current facility-administered medications on file prior to visit.     Allergies: No Known Allergies   Medical History:  Past Medical History:  Diagnosis Date   Anxiety    Depression    Hypothyroid  SVD (spontaneous vaginal delivery) 05/12/2014   Family history- Reviewed and unchanged Social history- Reviewed and unchanged   Review of Systems:  ROS    Physical Exam: There were no vitals taken for this visit. Wt Readings from Last 3 Encounters:  04/14/20 157 lb (71.2 kg)  03/13/19 157 lb 9.6 oz (71.5 kg)  11/12/18 166 lb 3.2 oz (75.4 kg)   General Appearance: Well nourished, in no apparent distress. Eyes: PERRLA, EOMs, conjunctiva no swelling or erythema Sinuses: No Frontal/maxillary tenderness ENT/Mouth: Ext aud canals clear, TMs  without erythema, bulging. No erythema, swelling, or exudate on post pharynx.  Tonsils not swollen or erythematous. Hearing normal.  Neck: Supple, thyroid normal.  Respiratory: Respiratory effort normal, BS equal bilaterally without rales, rhonchi, wheezing or stridor.  Cardio: RRR with no MRGs. Brisk peripheral pulses without edema.  Abdomen: Soft, + BS.  Non tender, no guarding, rebound, hernias, masses. Lymphatics: Non tender without lymphadenopathy.  Musculoskeletal: Full ROM, 5/5 strength, {PSY - GAIT AND STATION:22860} gait Skin: Warm, dry without rashes, lesions, ecchymosis.  Neuro: Cranial nerves intact. No cerebellar symptoms.  Psych: Awake and oriented X 3, normal affect, Insight and Judgment appropriate.    Revonda Humphrey, NP 7:47 PM [facility]

## 2020-11-05 ENCOUNTER — Ambulatory Visit: Payer: 59 | Admitting: Nurse Practitioner

## 2020-11-05 DIAGNOSIS — E039 Hypothyroidism, unspecified: Secondary | ICD-10-CM

## 2020-11-05 DIAGNOSIS — E559 Vitamin D deficiency, unspecified: Secondary | ICD-10-CM

## 2020-11-05 DIAGNOSIS — Z79899 Other long term (current) drug therapy: Secondary | ICD-10-CM

## 2020-11-05 DIAGNOSIS — F3341 Major depressive disorder, recurrent, in partial remission: Secondary | ICD-10-CM

## 2020-11-05 DIAGNOSIS — E782 Mixed hyperlipidemia: Secondary | ICD-10-CM

## 2020-12-04 ENCOUNTER — Other Ambulatory Visit: Payer: Self-pay

## 2020-12-04 DIAGNOSIS — F3341 Major depressive disorder, recurrent, in partial remission: Secondary | ICD-10-CM

## 2020-12-04 MED ORDER — ALPRAZOLAM 0.5 MG PO TABS: ORAL_TABLET | ORAL | 0 refills | Status: DC

## 2021-01-31 ENCOUNTER — Other Ambulatory Visit: Payer: Self-pay

## 2021-01-31 DIAGNOSIS — F3341 Major depressive disorder, recurrent, in partial remission: Secondary | ICD-10-CM

## 2021-02-01 ENCOUNTER — Other Ambulatory Visit: Payer: Self-pay | Admitting: Internal Medicine

## 2021-02-01 MED ORDER — ALPRAZOLAM 0.5 MG PO TABS: ORAL_TABLET | ORAL | 0 refills | Status: DC

## 2021-02-02 ENCOUNTER — Other Ambulatory Visit: Payer: Self-pay | Admitting: Nurse Practitioner

## 2021-02-02 DIAGNOSIS — F3341 Major depressive disorder, recurrent, in partial remission: Secondary | ICD-10-CM

## 2021-03-17 ENCOUNTER — Encounter: Payer: 59 | Admitting: Adult Health

## 2021-03-21 ENCOUNTER — Encounter: Payer: Self-pay | Admitting: Adult Health

## 2021-03-21 ENCOUNTER — Other Ambulatory Visit: Payer: Self-pay | Admitting: Adult Health

## 2021-03-21 DIAGNOSIS — F3341 Major depressive disorder, recurrent, in partial remission: Secondary | ICD-10-CM

## 2021-03-22 ENCOUNTER — Other Ambulatory Visit: Payer: Self-pay | Admitting: Nurse Practitioner

## 2021-03-22 DIAGNOSIS — G8929 Other chronic pain: Secondary | ICD-10-CM

## 2021-03-22 MED ORDER — ALPRAZOLAM 0.5 MG PO TABS: ORAL_TABLET | ORAL | 0 refills | Status: DC

## 2021-03-22 MED ORDER — IBUPROFEN 800 MG PO TABS: 800.0000 mg | ORAL_TABLET | Freq: Three times a day (TID) | ORAL | 1 refills | Status: DC | PRN

## 2021-04-14 ENCOUNTER — Ambulatory Visit (INDEPENDENT_AMBULATORY_CARE_PROVIDER_SITE_OTHER): Payer: 59 | Admitting: Adult Health

## 2021-04-14 ENCOUNTER — Encounter: Payer: Self-pay | Admitting: Adult Health

## 2021-04-14 ENCOUNTER — Other Ambulatory Visit: Payer: Self-pay

## 2021-04-14 VITALS — BP 110/72 | HR 58 | Temp 97.9°F | Ht 68.0 in | Wt 148.0 lb

## 2021-04-14 DIAGNOSIS — E039 Hypothyroidism, unspecified: Secondary | ICD-10-CM

## 2021-04-14 DIAGNOSIS — Z130 Encounter for screening for diseases of the blood and blood-forming organs and certain disorders involving the immune mechanism: Secondary | ICD-10-CM

## 2021-04-14 DIAGNOSIS — Z1389 Encounter for screening for other disorder: Secondary | ICD-10-CM

## 2021-04-14 DIAGNOSIS — Z79899 Other long term (current) drug therapy: Secondary | ICD-10-CM

## 2021-04-14 DIAGNOSIS — Z6822 Body mass index (BMI) 22.0-22.9, adult: Secondary | ICD-10-CM

## 2021-04-14 DIAGNOSIS — Z833 Family history of diabetes mellitus: Secondary | ICD-10-CM

## 2021-04-14 DIAGNOSIS — E538 Deficiency of other specified B group vitamins: Secondary | ICD-10-CM

## 2021-04-14 DIAGNOSIS — E559 Vitamin D deficiency, unspecified: Secondary | ICD-10-CM

## 2021-04-14 DIAGNOSIS — E785 Hyperlipidemia, unspecified: Secondary | ICD-10-CM

## 2021-04-14 DIAGNOSIS — F17210 Nicotine dependence, cigarettes, uncomplicated: Secondary | ICD-10-CM

## 2021-04-14 DIAGNOSIS — Z0000 Encounter for general adult medical examination without abnormal findings: Secondary | ICD-10-CM | POA: Diagnosis not present

## 2021-04-14 DIAGNOSIS — Z131 Encounter for screening for diabetes mellitus: Secondary | ICD-10-CM

## 2021-04-14 DIAGNOSIS — F419 Anxiety disorder, unspecified: Secondary | ICD-10-CM

## 2021-04-14 DIAGNOSIS — Z13228 Encounter for screening for other metabolic disorders: Secondary | ICD-10-CM

## 2021-04-14 DIAGNOSIS — F3341 Major depressive disorder, recurrent, in partial remission: Secondary | ICD-10-CM

## 2021-04-14 NOTE — Progress Notes (Signed)
Complete Physical  Assessment and Plan:  Routine general medical examination at a health care facility Due annually  Health Maintenance reviewed Healthy lifestyle reviewed and goals set  Follow up GYN for PAP, mammogram  Recommended she schedule screening eye exam, dental exam Declines all vaccines today  Hypothyroidism, unspecified type Hypothyroidism-check TSH level, continue medications the same, reminded to take on an empty stomach 30-65mns before food.  -     TSH  Recurrent major depressive disorder, in partial remission (HCC) Lifestyle discussed: diet/exerise, sleep hygiene, stress management, hydration -     sertraline (ZOLOFT) 100 MG tablet; Take 1 tablet daily.   Anxiety Well managed by current regimen; continue medications Stress management techniques discussed, increase water, good sleep hygiene discussed, increase exercise, and increase veggies.   Vitamin D deficiency Continue supplement, defer check due to cost  Medication management -     CBC with Differential/Platelet -     CMP/GFR -     Magnesium  Hyperlipidemia Mild/mod, low risk history, working on lifestyle  Continue low cholesterol diet and exercise.  Check lipid panel.  -     Lipid panel  Screening for hematuria or proteinuria -     Urinalysis, Routine w reflex microscopic  Vitamin B12 def      Start on supplement, check next visit   -      Vitamin B12    Current light smoker Discussed risks associated with tobacco use and advised to reduce or quit Patient is not ready to do so, but advised to consider strongly; resources given Will follow up at the next visit  Excess alcohol intake ~14/week with stress, receptive to reducing, try to limit to no more than 7/week   Orders Placed This Encounter  Procedures   CBC with Differential/Platelet   COMPLETE METABOLIC PANEL WITH GFR   Lipid panel   TSH   VITAMIN D 25 Hydroxy (Vit-D Deficiency, Fractures)   Vitamin B12   Urinalysis, Routine w  reflex microscopic   Hemoglobin A1c     Discussed med's effects and SE's. Screening labs and tests as requested with regular follow-up as recommended. Over 40 minutes of exam, counseling, chart review, and complex, high level critical decision making was performed this visit.  Future Appointments  Date Time [place] 04/14/2022  3:00 PM CLiane Comber NP [facility] None    HPI  46y.o. female  presents for a complete physical and follow up for has Hypothyroid; Recurrent major depression in partial remission ([place]; Vitamin D deficiency; Medication management; Anxiety; Light cigarette smoker (1-9 cigarettes per day); B12 deficiency; and Hyperlipidemia on their problem list.   She is married, 2 kids - 14y/o boy and 679y/o girl - doing well.  Works from home, [employer] enjoys her job. Dog passed, just got a new [place].  She is followed by Dr. GHazle Coca- at [facility], will call to schedule this week, doing PAP and mammogram in office.   She has hx of depression and anxiety, taking sertraline 50 mg daily working well, will take xanax occ, estimates 1-2 times per week.   She is very light intermittent smoker, 2-3 cigs currently, smoking intermittent since she was 16. Discussed risks associated with smoking, patient is not ready to quit.   BMI is Body mass index is 22.5 kg/m., she has not been working on diet and exercise, makes some good choices but knows she needs to do better, trying to increase veggies. Will occasionally walk in  neighborhood, with warmer weather will walk with youngest.  Water: increased to 3 glasses/day, some soda (sips throughout the day), some coffee  Drinking a bit more alcohol, estimates ~14 beer/week, receptive to reducing, smokes more when she drinks Wt Readings from Last 3 Encounters:  04/14/21 148 lb (67.1 kg)  04/14/20 157 lb (71.2 kg)  03/13/19 157 lb 9.6 oz (71.5 kg)   Her blood pressure has been controlled at home, today their  BP is BP: 110/72 She does not workout. She denies chest pain, shortness of breath, dizziness.   She is not on cholesterol medication and denies myalgias, no CVA/MI family history. Her cholesterol is at goal. The cholesterol last visit was:   Lab Results  Component Value Date   CHOL 191 04/14/2020   HDL 52 04/14/2020   LDLCALC 110 (H) 04/14/2020   TRIG 176 (H) 04/14/2020   CHOLHDL 3.7 04/14/2020   . Last A1C in the office was:   Lab Results  Component Value Date   HGBA1C 5.1 04/14/2020    Last GFR:  Lab Results  Component Value Date   GFRNONAA 79 04/14/2020   Patient is on Vitamin D supplement, taking 5000 IU most days  Lab Results  Component Value Date   VD25OH 33 04/14/2020     She is on thyroid medication, taking it in the AM, waiting an hour. Her medication was not changed last visit, never returned for recheck She is on 1 daily, tries to remember to take 30 min prior to coffee but admits forgets to take or will throw up pill occasionally.  Lab Results  Component Value Date   TSH 4.92 (H) 04/14/2020   She is not currently on supplement for B12,  Lab Results  Component Value Date   VITAMINB12 275 04/14/2020     Current Medications:  Current Outpatient Medications on File Prior to Visit  Medication Sig Dispense Refill   ALPRAZolam (XANAX) 0.5 MG tablet TAKE 1/2 TO 1 TABLET ONE TO TWO TIMES PER DAY ONLY IF NEEDED FOR ANXIETY ATTACK AND TRY TO LIMIT TO 5 DAYS PER WEEK TO AVOID ADDICTION. 55 tablet 0   Cholecalciferol (VITAMIN D3) 5000 units TABS Take 5,000 Units by mouth daily.     ibuprofen (ADVIL) 800 MG tablet Take 1 tablet (800 mg total) by mouth every 8 (eight) hours as needed. 60 tablet 1   levonorgestrel-ethinyl estradiol (KURVELO) 0.15-30 MG-MCG tablet Take 1 tablet Daily 84 tablet 3   levothyroxine (EUTHYROX) 175 MCG tablet Take 1 tablet daily on an empty stomach with only water for 30 minutes & no Antacid meds, Calcium or Magnesium for 4 hours & avoid Biotin 90  tablet 3   sertraline (ZOLOFT) 100 MG tablet Take 1/2 to 1 tablet daily. 90 tablet 3   No current facility-administered medications on file prior to visit.   Allergies:  No Known Allergies   Medical History:  She has Hypothyroid; Recurrent major depression in partial remission ([place]); Vitamin D deficiency; Medication management; Anxiety; Light cigarette smoker (1-9 cigarettes per day); B12 deficiency; and Hyperlipidemia on their problem list.   Health Maintenance:   Immunization History  Administered Date(s) Administered   Influenza Inj Mdck Quad With Preservative 03/07/2018   Influenza, Seasonal, Injecte, Preservative Fre 04/13/2016   Influenza-Unspecified 05/31/2017   MMR 05/14/2014   Tetanus: 01/2014 Flu vaccine: 2019 declines  Pneumonia: declines Covid 19: declines   No LMP recorded. Pap: 04/2020  at GYN, remote hx of abnormal, will request report MGM: 04/2020 getting  annuallly at GYN  Colonoscopy: discuss age 78  EGD:  Last eye: remote, no issues Last dental: last several years, encouraged to schedule  Last derm: never   Patient Care Team: Unk Pinto, MD as PCP - General (Internal Medicine) Dian Queen, MD as Consulting Physician (Obstetrics and Gynecology)  Surgical History:  She has a past surgical history that includes Heart chamber revision (1979) and Wisdom tooth extraction (1999). Family History:  Herfamily history includes COPD in her paternal grandmother; Diabetes type II in her maternal aunt; Hyperlipidemia in her father and mother; Lung cancer in her maternal grandmother. Social History:  She reports that she has been smoking cigarettes. She started smoking about 30 years ago. She has been smoking an average of .2 packs per day. She has never used smokeless tobacco. She reports current alcohol use of about 14.0 standard drinks per week. She reports that she does not use drugs.  Review of Systems: Review of Systems  Constitutional:  Negative for  malaise/fatigue and weight loss.  HENT:  Negative for hearing loss and tinnitus.   Eyes:  Negative for blurred vision and double vision.  Respiratory:  Negative for cough, shortness of breath and wheezing.   Cardiovascular:  Negative for chest pain, palpitations, orthopnea, claudication and leg swelling.  Gastrointestinal:  Negative for abdominal pain, blood in stool, constipation, diarrhea, heartburn, melena, nausea and vomiting.  Genitourinary: Negative.   Musculoskeletal:  Negative for joint pain and myalgias.  Skin:  Negative for rash.  Neurological:  Negative for dizziness, tingling, sensory change, weakness and headaches.  Endo/Heme/Allergies:  Negative for polydipsia.  Psychiatric/Behavioral: Negative.    All other systems reviewed and are negative.  Physical Exam: Estimated body mass index is 22.5 kg/m as calculated from the following:   Height as of this encounter: '5\' 8"'  (1.727 m).   Weight as of this encounter: 148 lb (67.1 kg). BP 110/72    Pulse (!) 58    Temp 97.9 F (36.6 C)    Ht '5\' 8"'  (1.727 m)    Wt 148 lb (67.1 kg)    SpO2 99%    BMI 22.50 kg/m  General Appearance: Well nourished, in no apparent distress.  Eyes: PERRLA, EOMs, conjunctiva no swelling or erythema Sinuses: No Frontal/maxillary tenderness  ENT/Mouth: Ext aud canals clear, normal light reflex with TMs without erythema, bulging. Good dentition. No erythema, swelling, or exudate on post pharynx. Tonsils not swollen or erythematous. Hearing normal.  Neck: Supple, thyroid normal. No bruits  Respiratory: Respiratory effort normal, BS equal bilaterally without rales, rhonchi, wheezing or stridor.  Cardio: RRR without murmurs, rubs or gallops. Brisk peripheral pulses without edema.  Chest: symmetric, with normal excursions and percussion.  Breasts: defer to GYN, getting mammograms Abdomen: Soft, nontender, no guarding, rebound, hernias, masses, or organomegaly.  Lymphatics: Non tender without lymphadenopathy.   Genitourinary: defer to GYN Musculoskeletal: Full ROM all peripheral extremities,5/5 strength, and normal gait.  Skin: Warm, dry without rashes, lesions, ecchymosis. Neuro: Cranial nerves intact, reflexes equal bilaterally. Normal muscle tone, no cerebellar symptoms. Sensation intact.  Psych: Awake and oriented X 3, normal affect, Insight and Judgment appropriate.   EKG: last in 2016 WNL, no concerns, defer  Izora Ribas, NP 3:48 PM [facility]

## 2021-04-14 NOTE — Patient Instructions (Addendum)
Alyssa Skinner , Thank you for taking time to come for your [facility] Visit. I appreciate your ongoing commitment to your health goals. Please review the following plan we discussed and let me know if I can assist you in the future.   These are the goals we discussed:  Goals      DIET - INCREASE WATER INTAKE     LDL CALC < 100     Reduce saturated fat - red meat, cheese, high fat dairy, highly processed snacks/oils, read labels  Increase fiber -      Quit Smoking     Reduce alcohol intake     Try to limit to no more than 7/week        This is a list of the screening recommended for you and due dates:  Health Maintenance  Topic Date Due   Pap Smear  06/22/2017   COVID-19 Vaccine (1) 04/30/2021*   Flu Shot  07/02/2021*   Pneumococcal Vaccination (1 - PCV) 04/14/2041*   Tetanus Vaccine  01/09/2024   Hepatitis C Screening: USPSTF Recommendation to screen - Ages 55-79 yo.  Completed   HIV Screening  Completed   HPV Vaccine  Aged Out  *Topic was postponed. The date shown is not the original due date.     Try adding b12 1000 mcg sublingual (dissolves under tongue) 1-2 days a week.  Set an alarm to remind your self    Please schedule eye screening exam  Please schedule dental exam      Know what a healthy weight is for you (roughly BMI <25) and aim to maintain this  Aim for 7+ servings of fruits and vegetables daily  65-80+ fluid ounces of water or unsweet tea for healthy kidneys  Limit to max 1 drink of alcohol per day; avoid smoking/tobacco  Limit animal fats in diet for cholesterol and heart health - choose grass fed whenever available  Avoid highly processed foods, and foods high in saturated/trans fats  Aim for low stress - take time to unwind and care for your mental health  Aim for 150 min of moderate intensity exercise weekly for heart health, and weights twice weekly for bone health  Aim for 7-9 hours of sleep daily     High-Fiber Eating  Plan Fiber, also called dietary fiber, is a type of carbohydrate. It is found foods such as fruits, vegetables, whole grains, and beans. A high-fiber diet can have many health benefits. Your health care provider may recommend a high-fiber diet to help: Prevent constipation. Fiber can make your bowel movements more regular. Lower your cholesterol. Relieve the following conditions: Inflammation of veins in the anus (hemorrhoids). Inflammation of specific areas of the digestive tract (uncomplicated diverticulosis). A problem of the large intestine, also called the colon, that sometimes causes pain and diarrhea (irritable bowel syndrome, or IBS). Prevent overeating as part of a weight-loss plan. Prevent heart disease, type 2 diabetes, and certain cancers. What are tips for following this plan? Reading food labels  Check the nutrition facts label on food products for the amount of dietary fiber. Choose foods that have 5 grams of fiber or more per serving. The goals for recommended daily fiber intake include: Men (age 45 or younger): 34-38 g. Men (over age 69): 28-34 g. Women (age 39 or younger): 25-28 g. Women (over age 23): 22-25 g. Your daily fiber goal is _____________ g. Shopping Choose whole fruits and vegetables instead of processed forms, such as apple juice or applesauce.  Choose a wide variety of high-fiber foods such as avocados, lentils, oats, and kidney beans. Read the nutrition facts label of the foods you choose. Be aware of foods with added fiber. These foods often have high sugar and sodium amounts per serving. Cooking Use whole-grain flour for baking and cooking. Cook with brown rice instead of white rice. Meal planning Start the day with a breakfast that is high in fiber, such as a cereal that contains 5 g of fiber or more per serving. Eat breads and cereals that are made with whole-grain flour instead of refined flour or white flour. Eat brown rice, bulgur wheat, or millet  instead of white rice. Use beans in place of meat in soups, salads, and pasta dishes. Be sure that half of the grains you eat each day are whole grains. General information You can get the recommended daily intake of dietary fiber by: Eating a variety of fruits, vegetables, grains, nuts, and beans. Taking a fiber supplement if you are not able to take in enough fiber in your diet. It is better to get fiber through food than from a supplement. Gradually increase how much fiber you consume. If you increase your intake of dietary fiber too quickly, you may have bloating, cramping, or gas. Drink plenty of water to help you digest fiber. Choose high-fiber snacks, such as berries, raw vegetables, nuts, and popcorn. What foods should I eat? Fruits Berries. Pears. Apples. Oranges. Avocado. Prunes and raisins. Dried figs. Vegetables Sweet potatoes. Spinach. Kale. Artichokes. Cabbage. Broccoli. Cauliflower. Green peas. Carrots. Squash. Grains Whole-grain breads. Multigrain cereal. Oats and oatmeal. Brown rice. Barley. Bulgur wheat. [place]. Quinoa. Bran muffins. Popcorn. Rye wafer crackers. Meats and other proteins Navy beans, kidney beans, and pinto beans. Soybeans. Split peas. Lentils. Nuts and seeds. Dairy Fiber-fortified yogurt. Beverages Fiber-fortified soy milk. Fiber-fortified orange juice. Other foods Fiber bars. The items listed above may not be a complete list of recommended foods and beverages. Contact a dietitian for more information. What foods should I avoid? Fruits Fruit juice. Cooked, strained fruit. Vegetables Fried potatoes. Canned vegetables. Well-cooked vegetables. Grains White bread. Pasta made with refined flour. White rice. Meats and other proteins Fatty cuts of meat. Fried chicken or fried fish. Dairy Milk. Yogurt. Cream cheese. Sour cream. Fats and oils Butters. Beverages Soft drinks. Other foods Cakes and pastries. The items listed above may not be a complete  list of foods and beverages to avoid. Talk with your dietitian about what choices are best for you. Summary Fiber is a type of carbohydrate. It is found in foods such as fruits, vegetables, whole grains, and beans. A high-fiber diet has many benefits. It can help to prevent constipation, lower blood cholesterol, aid weight loss, and reduce your risk of heart disease, diabetes, and certain cancers. Increase your intake of fiber gradually. Increasing fiber too quickly may cause cramping, bloating, and gas. Drink plenty of water while you increase the amount of fiber you consume. The best sources of fiber include whole fruits and vegetables, whole grains, nuts, seeds, and beans. This information is not intended to replace advice given to you by your health care provider. Make sure you discuss any questions you have with your health care provider. Document Revised: 07/25/2019 Document Reviewed: 07/25/2019 Elsevier Patient Education  2022 [employer].

## 2021-04-15 LAB — CBC WITH DIFFERENTIAL/PLATELET
Absolute Monocytes: 462 cells/uL (ref 200–950)
Basophils Absolute: 40 cells/uL (ref 0–200)
Basophils Relative: 0.7 %
Eosinophils Absolute: 148 cells/uL (ref 15–500)
Eosinophils Relative: 2.6 %
HCT: 39.5 % (ref 35.0–45.0)
Hemoglobin: 13.1 g/dL (ref 11.7–15.5)
Lymphs Abs: 1699 cells/uL (ref 850–3900)
MCH: 29.4 pg (ref 27.0–33.0)
MCHC: 33.2 g/dL (ref 32.0–36.0)
MCV: 88.8 fL (ref 80.0–100.0)
MPV: 11.8 fL (ref 7.5–12.5)
Monocytes Relative: 8.1 %
Neutro Abs: 3352 cells/uL (ref 1500–7800)
Neutrophils Relative %: 58.8 %
Platelets: 240 10*3/uL (ref 140–400)
RBC: 4.45 10*6/uL (ref 3.80–5.10)
RDW: 12 % (ref 11.0–15.0)
Total Lymphocyte: 29.8 %
WBC: 5.7 10*3/uL (ref 3.8–10.8)

## 2021-04-15 LAB — COMPLETE METABOLIC PANEL WITH GFR
AG Ratio: 1.8 (calc) (ref 1.0–2.5)
ALT: 16 U/L (ref 6–29)
AST: 14 U/L (ref 10–30)
Albumin: 4.3 g/dL (ref 3.6–5.1)
Alkaline phosphatase (APISO): 49 U/L (ref 31–125)
BUN: 10 mg/dL (ref 7–25)
CO2: 26 mmol/L (ref 20–32)
Calcium: 9.6 mg/dL (ref 8.6–10.2)
Chloride: 103 mmol/L (ref 98–110)
Creat: 0.68 mg/dL (ref 0.50–0.99)
Globulin: 2.4 g/dL (calc) (ref 1.9–3.7)
Glucose, Bld: 88 mg/dL (ref 65–99)
Potassium: 4.2 mmol/L (ref 3.5–5.3)
Sodium: 136 mmol/L (ref 135–146)
Total Bilirubin: 0.5 mg/dL (ref 0.2–1.2)
Total Protein: 6.7 g/dL (ref 6.1–8.1)
eGFR: 110 mL/min/{1.73_m2} (ref >=60)

## 2021-04-15 LAB — LIPID PANEL
Cholesterol: 173 mg/dL (ref <200)
HDL: 43 mg/dL — ABNORMAL LOW (ref >=50)
LDL Cholesterol (Calc): 109 mg/dL (calc) — ABNORMAL HIGH
Non-HDL Cholesterol (Calc): 130 mg/dL (calc) — ABNORMAL HIGH (ref <130)
Total CHOL/HDL Ratio: 4 (calc) (ref <5.0)
Triglycerides: 107 mg/dL (ref <150)

## 2021-04-15 LAB — URINALYSIS, ROUTINE W REFLEX MICROSCOPIC
Bilirubin Urine: NEGATIVE
Glucose, UA: NEGATIVE
Hgb urine dipstick: NEGATIVE
Ketones, ur: NEGATIVE
Leukocytes,Ua: NEGATIVE
Nitrite: NEGATIVE
Protein, ur: NEGATIVE
Specific Gravity, Urine: 1.013 (ref 1.001–1.035)
pH: 6 (ref 5.0–8.0)

## 2021-04-15 LAB — HEMOGLOBIN A1C
Hgb A1c MFr Bld: 5.3 % of total Hgb (ref <5.7)
Mean Plasma Glucose: 105 mg/dL
eAG (mmol/L): 5.8 mmol/L

## 2021-04-15 LAB — TSH: TSH: 0.06 mIU/L — ABNORMAL LOW

## 2021-04-24 ENCOUNTER — Encounter: Payer: Self-pay | Admitting: Adult Health

## 2021-04-24 ENCOUNTER — Other Ambulatory Visit: Payer: Self-pay | Admitting: Internal Medicine

## 2021-04-24 DIAGNOSIS — E039 Hypothyroidism, unspecified: Secondary | ICD-10-CM

## 2021-04-24 MED ORDER — LEVOTHYROXINE SODIUM 175 MCG PO TABS: ORAL_TABLET | ORAL | 3 refills | Status: DC

## 2021-05-19 ENCOUNTER — Encounter: Payer: Self-pay | Admitting: Internal Medicine

## 2021-06-02 ENCOUNTER — Other Ambulatory Visit: Payer: Self-pay | Admitting: Nurse Practitioner

## 2021-06-02 DIAGNOSIS — F3341 Major depressive disorder, recurrent, in partial remission: Secondary | ICD-10-CM

## 2021-06-03 MED ORDER — ALPRAZOLAM 0.5 MG PO TABS: ORAL_TABLET | ORAL | 0 refills | Status: DC

## 2021-07-05 ENCOUNTER — Other Ambulatory Visit: Payer: Self-pay | Admitting: Adult Health

## 2021-07-05 ENCOUNTER — Encounter: Payer: Self-pay | Admitting: Adult Health

## 2021-07-05 DIAGNOSIS — F3341 Major depressive disorder, recurrent, in partial remission: Secondary | ICD-10-CM

## 2021-07-06 ENCOUNTER — Other Ambulatory Visit: Payer: Self-pay | Admitting: Adult Health

## 2021-07-06 DIAGNOSIS — M545 Low back pain, unspecified: Secondary | ICD-10-CM

## 2021-07-06 DIAGNOSIS — G8929 Other chronic pain: Secondary | ICD-10-CM

## 2021-07-06 MED ORDER — IBUPROFEN 800 MG PO TABS: 800.0000 mg | ORAL_TABLET | Freq: Three times a day (TID) | ORAL | 1 refills | Status: DC | PRN

## 2021-07-07 ENCOUNTER — Telehealth: Payer: Self-pay

## 2021-07-07 NOTE — Telephone Encounter (Signed)
Refill request for Alprazolam 

## 2021-07-08 ENCOUNTER — Encounter: Payer: Self-pay | Admitting: Adult Health

## 2021-07-08 MED ORDER — ALPRAZOLAM 0.5 MG PO TABS: ORAL_TABLET | ORAL | 0 refills | Status: DC

## 2021-07-18 ENCOUNTER — Encounter: Payer: Self-pay | Admitting: Adult Health

## 2021-07-19 ENCOUNTER — Other Ambulatory Visit: Payer: Self-pay | Admitting: Nurse Practitioner

## 2021-07-19 MED ORDER — LEVONORGESTREL-ETHINYL ESTRAD 0.15-30 MG-MCG PO TABS: ORAL_TABLET | ORAL | 3 refills | Status: DC

## 2021-09-22 ENCOUNTER — Other Ambulatory Visit: Payer: Self-pay | Admitting: Adult Health

## 2021-09-22 ENCOUNTER — Encounter: Payer: Self-pay | Admitting: Adult Health

## 2021-09-22 DIAGNOSIS — F3341 Major depressive disorder, recurrent, in partial remission: Secondary | ICD-10-CM

## 2021-09-22 MED ORDER — ALPRAZOLAM 0.5 MG PO TABS: ORAL_TABLET | ORAL | 0 refills | Status: DC

## 2021-09-23 ENCOUNTER — Other Ambulatory Visit: Payer: Self-pay | Admitting: Adult Health

## 2021-09-23 DIAGNOSIS — F419 Anxiety disorder, unspecified: Secondary | ICD-10-CM

## 2021-09-23 DIAGNOSIS — F3341 Major depressive disorder, recurrent, in partial remission: Secondary | ICD-10-CM

## 2021-09-23 MED ORDER — SERTRALINE HCL 50 MG PO TABS: ORAL_TABLET | ORAL | 3 refills | Status: DC

## 2021-10-08 NOTE — Progress Notes (Unsigned)
6 MONTH FOLLOW UP  Assessment and Plan:   Hypothyroidism, unspecified type -check TSH level, continue medications the same, reminded to take on an empty stomach 30-57mns before food.  -     TSH  Recurrent major depressive disorder, in partial remission (HCC) Lifestyle discussed: diet/exerise, sleep hygiene, stress management, hydration -     sertraline (ZOLOFT) 100 MG tablet; Take 1 tablet daily. ***   Anxiety Well managed by current regimen; continue medications Stress management techniques discussed, increase water, good sleep hygiene discussed, increase exercise, and increase veggies.   Vitamin D deficiency Continue supplement, defer check due to cost  Medication management -     CBC with Differential/Platelet -     CMP/GFR -     Magnesium  Hyperlipidemia Mild/mod, low risk history, working on lifestyle  Continue low cholesterol diet and exercise.  Check lipid panel.  -     Lipid panel  Vitamin B12 def      Start on supplement, check next visit   -      Vitamin B12  Current light smoker Discussed risks associated with tobacco use and advised to reduce or quit Patient is not ready to do so, but advised to consider strongly; resources given Will follow up at the next visit  Excess alcohol intake ~14/week with stress, receptive to reducing, try to limit to no more than 7/week   No orders of the defined types were placed in this encounter.    Discussed med's effects and SE's. Labs and tests as requested with regular follow-up as recommended. Over 30 minutes of exam, counseling, chart review, and complex, high level critical decision making was performed this visit.  Future Appointments  Date Time Provider [place] 10/12/2021  9:00 AM CLiane Comber NP [facility] None  04/14/2022  3:00 PM CDarrol Jump NP [facility] None    HPI  46y.o. female  presents for 6 month follow up for has Hypothyroid; Recurrent major depression in partial remission  ([place]; Vitamin D deficiency; Medication management; Anxiety; Light cigarette smoker (1-9 cigarettes per day); B12 deficiency; and Hyperlipidemia on their problem list.    She has hx of depression and anxiety, taking sertraline 50 mg daily working well, will take xanax occ, estimates 1-2 times per week.   She is very light intermittent smoker, 2-3 cigs currently, smoking intermittent since she was 16. Discussed risks associated with smoking, patient is not ready to quit.   BMI is There is no height or weight on file to calculate BMI., she has not been working on diet and exercise, makes some good choices but knows she needs to do better, trying to increase veggies. Will occasionally walk in neighborhood, with warmer weather will walk with youngest.  Water: increased to 3 glasses/day, some soda (sips throughout the day), some coffee  Drinking a bit more alcohol, estimates ~14 beer/week, receptive to reducing, smokes more when she drinks Wt Readings from Last 3 Encounters:  04/14/21 148 lb (67.1 kg)  04/14/20 157 lb (71.2 kg)  03/13/19 157 lb 9.6 oz (71.5 kg)   Her blood pressure has been controlled at home, today their BP is   She does not workout. She denies chest pain, shortness of breath, dizziness.   She is not on cholesterol medication and denies myalgias, no CVA/MI family history. Her cholesterol is at goal. The cholesterol last visit was:   Lab Results  Component Value Date   CHOL 173 04/14/2021   HDL 43 (L) 04/14/2021   [place]  109 (H) 04/14/2021   TRIG 107 04/14/2021   CHOLHDL 4.0 04/14/2021   . Last A1C in the office was:   Lab Results  Component Value Date   HGBA1C 5.3 04/14/2021    Last GFR:  Lab Results  Component Value Date   EGFR 110 04/14/2021   Patient is on Vitamin D supplement, taking 5000 IU most days  Lab Results  Component Value Date   VD25OH 33 04/14/2020     She is on thyroid medication, taking it in the AM, waiting an hour. Her medication was not  changed last visit, never returned for recheck She is on 1 daily, tries to remember to take 30 min prior to coffee but admits forgets to take or will throw up pill occasionally.  Lab Results  Component Value Date   TSH 0.06 (L) 04/14/2021   She is not currently on supplement for B12,  Lab Results  Component Value Date   VITAMINB12 275 04/14/2020     Current Medications:  Current Outpatient Medications on File Prior to Visit  Medication Sig Dispense Refill   ALPRAZolam (XANAX) 0.5 MG tablet TAKE 1/2 TO 1 TABLET ONE TO TWO TIMES PER DAY ONLY IF NEEDED FOR ANXIETY ATTACK AND TRY TO LIMIT TO 5 DAYS PER WEEK TO AVOID ADDICTION. 50 tablet 0   Cholecalciferol (VITAMIN D3) 5000 units TABS Take 5,000 Units by mouth daily.     ibuprofen (ADVIL) 800 MG tablet Take 1 tablet (800 mg total) by mouth every 8 (eight) hours as needed. 60 tablet 1   levonorgestrel-ethinyl estradiol (KURVELO) 0.15-30 MG-MCG tablet Take 1 tablet Daily 84 tablet 3   levothyroxine (EUTHYROX) 175 MCG tablet Take 1 tablet daily on an empty stomach with only water for 30 minutes & no Antacid meds, Calcium or Magnesium for 4 hours & avoid Biotin 90 tablet 3   sertraline (ZOLOFT) 50 MG tablet Take 1 tablet daily for mood. 90 tablet 3   No current facility-administered medications on file prior to visit.   Allergies:  No Known Allergies   Medical History:  She has Hypothyroid; Recurrent major depression in partial remission ([place]); Vitamin D deficiency; Medication management; Anxiety; Light cigarette smoker (1-9 cigarettes per day); B12 deficiency; and Hyperlipidemia on their problem list.    Surgical History:  She has a past surgical history that includes Heart chamber revision (1979) and Wisdom tooth extraction (1999). Family History:  Herfamily history includes COPD in her paternal grandmother; Diabetes type II in her maternal aunt; Hyperlipidemia in her father and mother; Lung cancer in her maternal grandmother. Social  History:  She reports that she has been smoking cigarettes. She started smoking about 30 years ago. She has been smoking an average of .2 packs per day. She has never used smokeless tobacco. She reports current alcohol use of about 14.0 standard drinks of alcohol per week. She reports that she does not use drugs.  *** Review of Systems: Review of Systems  Constitutional:  Negative for malaise/fatigue and weight loss.  HENT:  Negative for hearing loss and tinnitus.   Eyes:  Negative for blurred vision and double vision.  Respiratory:  Negative for cough, shortness of breath and wheezing.   Cardiovascular:  Negative for chest pain, palpitations, orthopnea, claudication and leg swelling.  Gastrointestinal:  Negative for abdominal pain, blood in stool, constipation, diarrhea, heartburn, melena, nausea and vomiting.  Genitourinary: Negative.   Musculoskeletal:  Negative for joint pain and myalgias.  Skin:  Negative for rash.  Neurological:  Negative for dizziness, tingling, sensory change, weakness and headaches.  Endo/Heme/Allergies:  Negative for polydipsia.  Psychiatric/Behavioral: Negative.    All other systems reviewed and are negative.   Physical Exam: Estimated body mass index is 22.5 kg/m as calculated from the following:   Height as of 04/14/21: '5\' 8"'  (1.727 m).   Weight as of 04/14/21: 148 lb (67.1 kg). There were no vitals taken for this visit. General Appearance: Well nourished, in no apparent distress.  Eyes: PERRLA, EOMs, conjunctiva no swelling or erythema Sinuses: No Frontal/maxillary tenderness  ENT/Mouth: Ext aud canals clear, normal light reflex with TMs without erythema, bulging. Good dentition. No erythema, swelling, or exudate on post pharynx. Tonsils not swollen or erythematous. Hearing normal.  Neck: Supple, thyroid normal. No bruits  Respiratory: Respiratory effort normal, BS equal bilaterally without rales, rhonchi, wheezing or stridor.  Cardio: RRR without  murmurs, rubs or gallops. Brisk peripheral pulses without edema.  Abdomen: Soft, nontender, no guarding, rebound, hernias, masses, or organomegaly.  Lymphatics: Non tender without lymphadenopathy.  Musculoskeletal: Full ROM all peripheral extremities,5/5 strength, and normal gait.  Skin: Warm, dry without rashes, lesions, ecchymosis. Neuro: Cranial nerves intact, reflexes equal bilaterally. Normal muscle tone, no cerebellar symptoms. Sensation intact.  Psych: Awake and oriented X 3, normal affect, Insight and Judgment appropriate.   Izora Ribas, NP 10:24 AM [facility]

## 2021-10-12 ENCOUNTER — Encounter: Payer: Self-pay | Admitting: Adult Health

## 2021-10-12 ENCOUNTER — Ambulatory Visit (INDEPENDENT_AMBULATORY_CARE_PROVIDER_SITE_OTHER): Payer: 59 | Admitting: Adult Health

## 2021-10-12 VITALS — BP 120/60 | HR 57 | Temp 97.3°F | Ht 68.0 in | Wt 150.0 lb

## 2021-10-12 DIAGNOSIS — Z79899 Other long term (current) drug therapy: Secondary | ICD-10-CM

## 2021-10-12 DIAGNOSIS — E039 Hypothyroidism, unspecified: Secondary | ICD-10-CM | POA: Diagnosis not present

## 2021-10-12 DIAGNOSIS — E559 Vitamin D deficiency, unspecified: Secondary | ICD-10-CM | POA: Diagnosis not present

## 2021-10-12 DIAGNOSIS — E785 Hyperlipidemia, unspecified: Secondary | ICD-10-CM | POA: Diagnosis not present

## 2021-10-12 DIAGNOSIS — F3341 Major depressive disorder, recurrent, in partial remission: Secondary | ICD-10-CM

## 2021-10-12 DIAGNOSIS — F17210 Nicotine dependence, cigarettes, uncomplicated: Secondary | ICD-10-CM

## 2021-10-12 DIAGNOSIS — E538 Deficiency of other specified B group vitamins: Secondary | ICD-10-CM

## 2021-10-12 NOTE — Patient Instructions (Signed)
B12 is low recommend adding sublingual B12. You can start with 250-500 mcg daily or 1000 mcg a few days a week. Please let us know what dose you started so we can update your med list accordingly. Will help with energy, memory/concentration, decrease nerve pain, and help with weight loss.       SMOKING CESSATION  [employer]  [phone] for more information or for a free program for smoking cessation help.   You can call QUIT SMART 1-800-QUIT-NOW for free nicotine patches or replacement therapy- if they are out- keep calling  [facility] Can call for smoking cessation classes, [phone]  If you have a smart phone, please look up Smoke Free app, this will help you stay on track and give you information about money you have saved, life that you have gained back and a ton of more information.     ADVANTAGES OF QUITTING SMOKING Within 20 minutes, blood pressure decreases. Your pulse is at normal level. After 8 hours, carbon monoxide levels in the blood return to normal. Your oxygen level increases. After 24 hours, the chance of having a heart attack starts to decrease. Your breath, hair, and body stop smelling like smoke. After 48 hours, damaged nerve endings begin to recover. Your sense of taste and smell improve. After 72 hours, the body is virtually free of nicotine. Your bronchial tubes relax and breathing becomes easier. After 2 to 12 weeks, lungs can hold more air. Exercise becomes easier and circulation improves. After 1 year, the risk of coronary heart disease is cut in half. After 5 years, the risk of stroke falls to the same as a nonsmoker. After 10 years, the risk of lung cancer is cut in half and the risk of other cancers decreases significantly. After 15 years, the risk of coronary heart disease drops, usually to the level of a nonsmoker. You will have extra money to spend on things other than cigarettes.

## 2021-10-13 LAB — COMPLETE METABOLIC PANEL WITH GFR
AG Ratio: 1.8 (calc) (ref 1.0–2.5)
ALT: 13 U/L (ref 6–29)
AST: 12 U/L (ref 10–30)
Albumin: 4.4 g/dL (ref 3.6–5.1)
Alkaline phosphatase (APISO): 49 U/L (ref 31–125)
BUN: 10 mg/dL (ref 7–25)
CO2: 24 mmol/L (ref 20–32)
Calcium: 9.4 mg/dL (ref 8.6–10.2)
Chloride: 104 mmol/L (ref 98–110)
Creat: 0.81 mg/dL (ref 0.50–0.99)
Globulin: 2.4 g/dL (calc) (ref 1.9–3.7)
Glucose, Bld: 93 mg/dL (ref 65–99)
Potassium: 4.6 mmol/L (ref 3.5–5.3)
Sodium: 137 mmol/L (ref 135–146)
Total Bilirubin: 0.6 mg/dL (ref 0.2–1.2)
Total Protein: 6.8 g/dL (ref 6.1–8.1)
eGFR: 92 mL/min/{1.73_m2} (ref >=60)

## 2021-10-13 LAB — LIPID PANEL
Cholesterol: 185 mg/dL (ref <200)
HDL: 52 mg/dL (ref >=50)
LDL Cholesterol (Calc): 110 mg/dL (calc) — ABNORMAL HIGH
Non-HDL Cholesterol (Calc): 133 mg/dL (calc) — ABNORMAL HIGH (ref <130)
Total CHOL/HDL Ratio: 3.6 (calc) (ref <5.0)
Triglycerides: 119 mg/dL (ref <150)

## 2021-10-13 LAB — TSH: TSH: 2.6 mIU/L

## 2021-10-27 ENCOUNTER — Encounter: Payer: Self-pay | Admitting: Nurse Practitioner

## 2021-10-27 DIAGNOSIS — F3341 Major depressive disorder, recurrent, in partial remission: Secondary | ICD-10-CM

## 2021-10-29 ENCOUNTER — Other Ambulatory Visit: Payer: Self-pay | Admitting: Nurse Practitioner

## 2021-10-29 DIAGNOSIS — F3341 Major depressive disorder, recurrent, in partial remission: Secondary | ICD-10-CM

## 2021-11-04 ENCOUNTER — Telehealth: Payer: Self-pay

## 2021-11-04 NOTE — Telephone Encounter (Signed)
Refill request for Alprazolam 

## 2021-12-01 MED ORDER — ALPRAZOLAM 0.5 MG PO TABS: ORAL_TABLET | ORAL | 0 refills | Status: DC

## 2022-02-03 ENCOUNTER — Other Ambulatory Visit: Payer: Self-pay | Admitting: Nurse Practitioner

## 2022-02-03 DIAGNOSIS — F3341 Major depressive disorder, recurrent, in partial remission: Secondary | ICD-10-CM

## 2022-02-03 MED ORDER — ALPRAZOLAM 0.5 MG PO TABS: ORAL_TABLET | ORAL | 0 refills | Status: DC

## 2022-04-01 ENCOUNTER — Other Ambulatory Visit: Payer: Self-pay | Admitting: Nurse Practitioner

## 2022-04-01 DIAGNOSIS — F3341 Major depressive disorder, recurrent, in partial remission: Secondary | ICD-10-CM

## 2022-04-04 MED ORDER — ALPRAZOLAM 0.5 MG PO TABS: ORAL_TABLET | ORAL | 0 refills | Status: DC

## 2022-04-14 ENCOUNTER — Encounter: Payer: 59 | Admitting: Nurse Practitioner

## 2022-05-02 ENCOUNTER — Encounter: Payer: 59 | Admitting: Nurse Practitioner

## 2022-05-03 ENCOUNTER — Ambulatory Visit (INDEPENDENT_AMBULATORY_CARE_PROVIDER_SITE_OTHER): Payer: 59 | Admitting: Nurse Practitioner

## 2022-05-03 ENCOUNTER — Encounter: Payer: Self-pay | Admitting: Nurse Practitioner

## 2022-05-03 VITALS — BP 128/80 | HR 72 | Temp 98.4°F | Ht 68.5 in | Wt 159.0 lb

## 2022-05-03 DIAGNOSIS — I10 Essential (primary) hypertension: Secondary | ICD-10-CM | POA: Diagnosis not present

## 2022-05-03 DIAGNOSIS — F419 Anxiety disorder, unspecified: Secondary | ICD-10-CM

## 2022-05-03 DIAGNOSIS — F3341 Major depressive disorder, recurrent, in partial remission: Secondary | ICD-10-CM

## 2022-05-03 DIAGNOSIS — G8929 Other chronic pain: Secondary | ICD-10-CM

## 2022-05-03 DIAGNOSIS — F1090 Alcohol use, unspecified, uncomplicated: Secondary | ICD-10-CM

## 2022-05-03 DIAGNOSIS — Z0001 Encounter for general adult medical examination with abnormal findings: Secondary | ICD-10-CM

## 2022-05-03 DIAGNOSIS — Z13228 Encounter for screening for other metabolic disorders: Secondary | ICD-10-CM

## 2022-05-03 DIAGNOSIS — E785 Hyperlipidemia, unspecified: Secondary | ICD-10-CM

## 2022-05-03 DIAGNOSIS — Z136 Encounter for screening for cardiovascular disorders: Secondary | ICD-10-CM

## 2022-05-03 DIAGNOSIS — F17210 Nicotine dependence, cigarettes, uncomplicated: Secondary | ICD-10-CM

## 2022-05-03 DIAGNOSIS — E559 Vitamin D deficiency, unspecified: Secondary | ICD-10-CM

## 2022-05-03 DIAGNOSIS — Z79899 Other long term (current) drug therapy: Secondary | ICD-10-CM

## 2022-05-03 DIAGNOSIS — Z0000 Encounter for general adult medical examination without abnormal findings: Secondary | ICD-10-CM

## 2022-05-03 DIAGNOSIS — Z1231 Encounter for screening mammogram for malignant neoplasm of breast: Secondary | ICD-10-CM

## 2022-05-03 DIAGNOSIS — E538 Deficiency of other specified B group vitamins: Secondary | ICD-10-CM

## 2022-05-03 DIAGNOSIS — E039 Hypothyroidism, unspecified: Secondary | ICD-10-CM

## 2022-05-03 DIAGNOSIS — Z1389 Encounter for screening for other disorder: Secondary | ICD-10-CM

## 2022-05-03 DIAGNOSIS — Z1211 Encounter for screening for malignant neoplasm of colon: Secondary | ICD-10-CM

## 2022-05-03 DIAGNOSIS — Z135 Encounter for screening for eye and ear disorders: Secondary | ICD-10-CM

## 2022-05-03 NOTE — Progress Notes (Signed)
Complete Physical  Assessment and Plan:  Routine general medical examination at a health care facility Due annually  Health Maintenance reviewed  Hypothyroidism, unspecified type Controlled. Continue Levothyroxine. Reminded to take on an empty stomach 30-39mins before food.  Stop any Biotin Supplement 48-72 hours before next TSH level to reduce the risk of falsely low TSH levels. Continue to monitor.     Recurrent major depressive disorder, in partial remission ([place]) Continue Zoloft Controlled Reviewed relaxation techniques.  Sleep hygiene. Recommended mindfulness meditation and exercise.   Encouraged personality growth wand development through coping techniques and problem-solving skills. Limit/Decrease/Monitor drug/alcohol intake.     Anxiety Well managed by current regimen; continue medications Stress management techniques discussed, increase water, good sleep hygiene discussed, increase exercise, and increase veggies.   Vitamin D deficiency Continue supplement Monitor levels  Medication management All medications discussed and reviewed in full. All questions and concerns regarding medications addressed.    Hyperlipidemia Discussed lifestyle modifications. Recommended diet heavy in fruits and veggies, omega 3's. Decrease consumption of animal meats, cheeses, and dairy products. Remain active and exercise as tolerated. Continue to monitor. Check lipids/TSH   Screening for hematuria or proteinuria -     Urinalysis, Routine w reflex microscopic  Current light smoker Smoking cessation instruction/counseling given:  counseled patient on the dangers of tobacco use, advised patient to stop smoking, and reviewed strategies to maximize success   Excess alcohol intake Limit to no more than 7/week Continue to monitor  Low Back pain Continue IBU PRN Rest when flared  Screening for metabolic disorder/DM Z6W  Screening for breast cancer Mammogram ordered -  suspicous malignancy  Screening for heart disease EKG  Screening for colon cancer Cologuard   Screening for eye condition Refer to Optometry  Orders Placed This Encounter  Procedures   MM Digital Screening    Standing Status:   Future    Standing Expiration Date:   05/10/2023    Order Specific Question:   Reason for Exam (SYMPTOM  OR DIAGNOSIS REQUIRED)    Answer:   Annual screening    Order Specific Question:   Is the patient pregnant?    Answer:   No    Order Specific Question:   Preferred imaging location?    Answer:   [facility]   CBC with Differential/Platelet   COMPLETE METABOLIC PANEL WITH GFR   Lipid panel   TSH   Hemoglobin A1c   Insulin, random   VITAMIN D 25 Hydroxy (Vit-D Deficiency, Fractures)   Urinalysis, Routine w reflex microscopic   Microalbumin / creatinine urine ratio   Cologuard   Ambulatory referral to Optometry    Referral Priority:   Routine    Referral Type:   [facility] [employer] planner)    Referral Reason:   Specialty Services Required    Requested Specialty:   Optometry    Number of Visits Requested:   1   EKG 12-Lead    Notify office for further evaluation and treatment, questions or concerns if any reported s/s fail to improve.   The patient was advised to call back or seek an in-person evaluation if any symptoms worsen or if the condition fails to improve as anticipated.   Further disposition pending results of labs. Discussed med's effects and SE's.    I discussed the assessment and treatment plan with the patient. The patient was provided an opportunity to ask questions and all were answered. The patient agreed with the plan and demonstrated an understanding of the instructions.  Discussed  med's effects and SE's. Screening labs and tests as requested with regular follow-up as recommended.  I provided 30 minutes of face-to-face time during this encounter including counseling, chart review, and critical decision making was  preformed.  Future Appointments  Date Time Provider [place]  05/04/2023  9:00 AM Darrol Jump, NP [facility] None    HPI  47 y.o. female  presents for a complete physical and follow up for has Hypothyroid; Recurrent major depression in partial remission ([place]); Vitamin D deficiency; Medication management; Anxiety; Light cigarette smoker (1-9 cigarettes per day); B12 deficiency; and Hyperlipidemia on their problem list.   She is married, 2 kids. Works from home, [employer], enjoys her job. Has no new concerns to report in clinic today.  She is followed by Dr. Hazle Coca - at [facility].  UTD on pap, goes yearly.   She has hx of depression and anxiety, taking sertraline 50 mg daily working well, will take xanax occ, estimates 1-2 times per week.   She is very light intermittent smoker, 2-3 cigs currently, smoking intermittent since she was 16. Discussed risks associated with smoking, patient is not ready to quit.   BMI is Body mass index is 23.82 kg/m., she has not been working on diet and exercise.  Smoking and drinking contribute to weight gain along with holidays.   Wt Readings from Last 3 Encounters:  05/03/22 159 lb (72.1 kg)  10/12/21 150 lb (68 kg)  04/14/21 148 lb (67.1 kg)   Her blood pressure has been controlled at home, today their BP is BP: 128/80 She does not workout. She denies chest pain, shortness of breath, dizziness.   She is not on cholesterol medication and denies myalgias, no CVA/MI family history. Her cholesterol is at goal. The cholesterol last visit was:   Lab Results  Component Value Date   CHOL 185 10/12/2021   HDL 52 10/12/2021   LDLCALC 110 (H) 10/12/2021   TRIG 119 10/12/2021   CHOLHDL 3.6 10/12/2021   . Last A1C in the office was:   Lab Results  Component Value Date   HGBA1C 5.3 04/14/2021    Last GFR:  Lab Results  Component Value Date   GFRNONAA 79 04/14/2020   Patient is on Vitamin D supplement, taking 5000 IU most days  Lab  Results  Component Value Date   VD25OH 33 04/14/2020     She is on thyroid medication, taking it in the AM, waiting an hour. Her medication was not changed last visit, never returned for recheck Lab Results  Component Value Date   TSH 2.60 10/12/2021   She is not currently on supplement for B12,  Lab Results  Component Value Date   VITAMINB12 275 04/14/2020     Current Medications:  Current Outpatient Medications on File Prior to Visit  Medication Sig Dispense Refill   ALPRAZolam (XANAX) 0.5 MG tablet TAKE 1/2 TO 1 TABLET ONE TO TWO TIMES PER DAY ONLY IF NEEDED FOR ANXIETY ATTACK AND TRY TO LIMIT TO 5 DAYS PER WEEK TO AVOID ADDICTION. 50 tablet 0   Cholecalciferol (VITAMIN D3) 5000 units TABS Take 5,000 Units by mouth daily.     ibuprofen (ADVIL) 800 MG tablet Take 1 tablet (800 mg total) by mouth every 8 (eight) hours as needed. 60 tablet 1   levonorgestrel-ethinyl estradiol (KURVELO) 0.15-30 MG-MCG tablet Take 1 tablet Daily 84 tablet 3   levothyroxine (EUTHYROX) 175 MCG tablet Take 1 tablet daily on an empty stomach with only water for  30 minutes & no Antacid meds, Calcium or Magnesium for 4 hours & avoid Biotin 90 tablet 3   sertraline (ZOLOFT) 50 MG tablet Take 1 tablet daily for mood. 90 tablet 3   Methylcobalamin (B12 SL) Place 500 mcg under the tongue daily. (Patient not taking: Reported on 05/03/2022)     No current facility-administered medications on file prior to visit.   Allergies:  No Known Allergies   Medical History:  She has Hypothyroid; Recurrent major depression in partial remission (HCC); Vitamin D deficiency; Medication management; Anxiety; Light cigarette smoker (1-9 cigarettes per day); B12 deficiency; and Hyperlipidemia on their problem list.   Health Maintenance:   Immunization History  Administered Date(s) Administered   Influenza Inj Mdck Quad With Preservative 03/07/2018   Influenza, Seasonal, Injecte, Preservative Fre 04/13/2016    Influenza-Unspecified 05/31/2017   MMR 05/14/2014   Tetanus: 01/2014 Flu vaccine: 2019 declines  Pneumonia: declines Covid 19: declines   No LMP recorded. Monthly. regular Pap: 07/2021  at GYN, remote hx of abnormal MGM: 04/2020 getting annuallly at GYN - due  Colonoscopy: Cologuard order placed EGD: N/A  Last eye: remote, no issues Last dental: last several years, encouraged to schedule  Last derm: never denies issue or concern with skin   Patient Care Team: Lucky Cowboy, MD as PCP - General (Internal Medicine) Marcelle Overlie, MD as Consulting Physician (Obstetrics and Gynecology)  Surgical History:  She has a past surgical history that includes Heart chamber revision (1979) and Wisdom tooth extraction (1999). Family History:  Herfamily history includes COPD in her paternal grandmother; Diabetes type II in her maternal aunt; Hyperlipidemia in her father and mother; Lung cancer in her maternal grandmother. Social History:  She reports that she has been smoking cigarettes. She started smoking about 31 years ago. She has been smoking an average of .2 packs per day. She has never used smokeless tobacco. She reports current alcohol use of about 14.0 standard drinks of alcohol per week. She reports that she does not use drugs.  Review of Systems: Review of Systems  Constitutional:  Negative for malaise/fatigue and weight loss.  HENT:  Negative for hearing loss and tinnitus.   Eyes:  Negative for blurred vision and double vision.  Respiratory:  Negative for cough, shortness of breath and wheezing.   Cardiovascular:  Negative for chest pain, palpitations, orthopnea, claudication and leg swelling.  Gastrointestinal:  Negative for abdominal pain, blood in stool, constipation, diarrhea, heartburn, melena, nausea and vomiting.  Genitourinary: Negative.   Musculoskeletal:  Negative for joint pain and myalgias.  Skin:  Negative for rash.  Neurological:  Negative for dizziness,  tingling, sensory change, weakness and headaches.  Endo/Heme/Allergies:  Negative for polydipsia.  Psychiatric/Behavioral: Negative.    All other systems reviewed and are negative.   Physical Exam: Estimated body mass index is 23.82 kg/m as calculated from the following:   Height as of this encounter: 5' 8.5" (1.74 m).   Weight as of this encounter: 159 lb (72.1 kg). BP 128/80   Pulse 72   Temp 98.4 F (36.9 C)   Ht 5' 8.5" (1.74 m)   Wt 159 lb (72.1 kg)   SpO2 99%   BMI 23.82 kg/m  General Appearance: Well nourished, in no apparent distress.  Eyes: PERRLA, EOMs, conjunctiva no swelling or erythema Sinuses: No Frontal/maxillary tenderness  ENT/Mouth: Ext aud canals clear, normal light reflex with TMs without erythema, bulging. Good dentition. No erythema, swelling, or exudate on post pharynx. Tonsils not swollen or erythematous.  Hearing normal.  Neck: Supple, thyroid normal. No bruits  Respiratory: Respiratory effort normal, BS equal bilaterally without rales, rhonchi, wheezing or stridor.  Cardio: RRR without murmurs, rubs or gallops. Brisk peripheral pulses without edema.  Chest: symmetric, with normal excursions and percussion.  Breasts: defer to GYN, getting mammograms Abdomen: Soft, nontender, no guarding, rebound, hernias, masses, or organomegaly.  Lymphatics: Non tender without lymphadenopathy.  Genitourinary: defer to GYN Musculoskeletal: Full ROM all peripheral extremities,5/5 strength, and normal gait.  Skin: Warm, dry without rashes, lesions, ecchymosis. Neuro: Cranial nerves intact, reflexes equal bilaterally. Normal muscle tone, no cerebellar symptoms. Sensation intact.  Psych: Awake and oriented X 3, normal affect, Insight and Judgment appropriate.   EKG: NSR  Darrol Jump, NP 9:39 AM [facility]

## 2022-05-03 NOTE — Patient Instructions (Signed)

## 2022-05-04 LAB — COMPLETE METABOLIC PANEL WITH GFR
AG Ratio: 1.5 (calc) (ref 1.0–2.5)
ALT: 22 U/L (ref 6–29)
AST: 15 U/L (ref 10–35)
Albumin: 4.2 g/dL (ref 3.6–5.1)
Alkaline phosphatase (APISO): 53 U/L (ref 31–125)
BUN: 12 mg/dL (ref 7–25)
CO2: 26 mmol/L (ref 20–32)
Calcium: 9.4 mg/dL (ref 8.6–10.2)
Chloride: 104 mmol/L (ref 98–110)
Creat: 0.72 mg/dL (ref 0.50–0.99)
Globulin: 2.8 g/dL (calc) (ref 1.9–3.7)
Glucose, Bld: 93 mg/dL (ref 65–99)
Potassium: 4.4 mmol/L (ref 3.5–5.3)
Sodium: 138 mmol/L (ref 135–146)
Total Bilirubin: 0.5 mg/dL (ref 0.2–1.2)
Total Protein: 7 g/dL (ref 6.1–8.1)
eGFR: 105 mL/min/{1.73_m2} (ref >=60)

## 2022-05-04 LAB — MICROALBUMIN / CREATININE URINE RATIO
Creatinine, Urine: 134 mg/dL (ref 20–275)
Microalb, Ur: <0.2 mg/dL

## 2022-05-04 LAB — URINALYSIS, ROUTINE W REFLEX MICROSCOPIC
Bilirubin Urine: NEGATIVE
Glucose, UA: NEGATIVE
Hgb urine dipstick: NEGATIVE
Ketones, ur: NEGATIVE
Leukocytes,Ua: NEGATIVE
Nitrite: NEGATIVE
Protein, ur: NEGATIVE
Specific Gravity, Urine: 1.021 (ref 1.001–1.035)
pH: 6 (ref 5.0–8.0)

## 2022-05-04 LAB — CBC WITH DIFFERENTIAL/PLATELET
Absolute Monocytes: 461 cells/uL (ref 200–950)
Basophils Absolute: 43 cells/uL (ref 0–200)
Basophils Relative: 0.6 %
Eosinophils Absolute: 410 cells/uL (ref 15–500)
Eosinophils Relative: 5.7 %
HCT: 37.9 % (ref 35.0–45.0)
Hemoglobin: 13 g/dL (ref 11.7–15.5)
Lymphs Abs: 1973 cells/uL (ref 850–3900)
MCH: 30 pg (ref 27.0–33.0)
MCHC: 34.3 g/dL (ref 32.0–36.0)
MCV: 87.3 fL (ref 80.0–100.0)
MPV: 11.4 fL (ref 7.5–12.5)
Monocytes Relative: 6.4 %
Neutro Abs: 4313 cells/uL (ref 1500–7800)
Neutrophils Relative %: 59.9 %
Platelets: 264 10*3/uL (ref 140–400)
RBC: 4.34 10*6/uL (ref 3.80–5.10)
RDW: 12.2 % (ref 11.0–15.0)
Total Lymphocyte: 27.4 %
WBC: 7.2 10*3/uL (ref 3.8–10.8)

## 2022-05-04 LAB — LIPID PANEL
Cholesterol: 193 mg/dL (ref <200)
HDL: 58 mg/dL (ref >=50)
LDL Cholesterol (Calc): 117 mg/dL (calc) — ABNORMAL HIGH
Non-HDL Cholesterol (Calc): 135 mg/dL (calc) — ABNORMAL HIGH (ref <130)
Total CHOL/HDL Ratio: 3.3 (calc) (ref <5.0)
Triglycerides: 82 mg/dL (ref <150)

## 2022-05-04 LAB — HEMOGLOBIN A1C
Hgb A1c MFr Bld: 5.5 % of total Hgb (ref <5.7)
Mean Plasma Glucose: 111 mg/dL
eAG (mmol/L): 6.2 mmol/L

## 2022-05-04 LAB — TSH: TSH: 3.65 mIU/L

## 2022-05-04 LAB — VITAMIN D 25 HYDROXY (VIT D DEFICIENCY, FRACTURES): Vit D, 25-Hydroxy: 28 ng/mL — ABNORMAL LOW (ref 30–100)

## 2022-05-04 LAB — INSULIN, RANDOM: Insulin: 14.8 u[IU]/mL

## 2022-05-22 ENCOUNTER — Other Ambulatory Visit: Payer: Self-pay | Admitting: Nurse Practitioner

## 2022-05-22 ENCOUNTER — Encounter: Payer: Self-pay | Admitting: Nurse Practitioner

## 2022-05-22 ENCOUNTER — Other Ambulatory Visit: Payer: Self-pay | Admitting: Internal Medicine

## 2022-05-22 DIAGNOSIS — F3341 Major depressive disorder, recurrent, in partial remission: Secondary | ICD-10-CM

## 2022-05-22 DIAGNOSIS — E039 Hypothyroidism, unspecified: Secondary | ICD-10-CM

## 2022-05-22 MED ORDER — LEVOTHYROXINE SODIUM 175 MCG PO TABS: ORAL_TABLET | ORAL | 3 refills | Status: DC

## 2022-05-23 MED ORDER — ALPRAZOLAM 0.5 MG PO TABS: ORAL_TABLET | ORAL | 0 refills | Status: DC

## 2022-07-28 ENCOUNTER — Encounter: Payer: Self-pay | Admitting: Nurse Practitioner

## 2022-07-28 ENCOUNTER — Other Ambulatory Visit: Payer: Self-pay | Admitting: Nurse Practitioner

## 2022-07-28 DIAGNOSIS — F3341 Major depressive disorder, recurrent, in partial remission: Secondary | ICD-10-CM

## 2022-07-28 DIAGNOSIS — M545 Low back pain, unspecified: Secondary | ICD-10-CM

## 2022-07-28 MED ORDER — ALPRAZOLAM 0.5 MG PO TABS: ORAL_TABLET | ORAL | 0 refills | Status: DC

## 2022-07-28 MED ORDER — IBUPROFEN 800 MG PO TABS: 800.0000 mg | ORAL_TABLET | Freq: Three times a day (TID) | ORAL | 1 refills | Status: DC | PRN

## 2022-08-01 ENCOUNTER — Encounter: Payer: Self-pay | Admitting: Nurse Practitioner

## 2022-08-23 ENCOUNTER — Other Ambulatory Visit: Payer: Self-pay | Admitting: Nurse Practitioner

## 2022-08-23 ENCOUNTER — Encounter: Payer: Self-pay | Admitting: Nurse Practitioner

## 2022-08-23 MED ORDER — LEVONORGESTREL-ETHINYL ESTRAD 0.15-30 MG-MCG PO TABS: ORAL_TABLET | ORAL | 3 refills | Status: DC

## 2022-09-30 ENCOUNTER — Other Ambulatory Visit: Payer: Self-pay | Admitting: Nurse Practitioner

## 2022-09-30 DIAGNOSIS — F3341 Major depressive disorder, recurrent, in partial remission: Secondary | ICD-10-CM

## 2022-10-02 MED ORDER — ALPRAZOLAM 0.5 MG PO TABS
ORAL_TABLET | ORAL | 0 refills | Status: DC
Start: 2022-10-02 — End: 2022-11-12

## 2022-10-03 ENCOUNTER — Other Ambulatory Visit: Payer: Self-pay | Admitting: Nurse Practitioner

## 2022-10-03 DIAGNOSIS — F3341 Major depressive disorder, recurrent, in partial remission: Secondary | ICD-10-CM

## 2022-10-31 ENCOUNTER — Ambulatory Visit: Payer: 59 | Admitting: Nurse Practitioner

## 2022-10-31 NOTE — Progress Notes (Deleted)
Follow Up  Assessment and Plan:  Hypothyroidism, unspecified type Controlled. Continue Levothyroxine. Reminded to take on an empty stomach 30-20mins before food.  Stop any Biotin Supplement 48-72 hours before next TSH level to reduce the risk of falsely low TSH levels. Continue to monitor.    Recurrent major depressive disorder, in partial remission (H[facility]) Continue Zoloft Controlled Reviewed relaxation techniques.  Sleep hygiene. Recommended mindfulness meditation and exercise.   Encouraged personality growth wand development through coping techniques and problem-solving skills. Limit/Decrease/Monitor drug/alcohol intake.     Anxiety Well managed by current regimen; continue medications Stress management techniques discussed, increase water, good sleep hygiene discussed, increase exercise, and increase veggies.   Vitamin D deficiency Continue supplement Monitor levels  Medication management All medications discussed and reviewed in full. All questions and concerns regarding medications addressed.    Hyperlipidemia Discussed lifestyle modifications. Recommended diet heavy in fruits and veggies, omega 3's. Decrease consumption of animal meats, cheeses, and dairy products. Remain active and exercise as tolerated. Continue to monitor. Check lipids/TSH  Current light smoker Smoking cessation instruction/counseling given:  counseled patient on the dangers of tobacco use, advised patient to stop smoking, and reviewed strategies to maximize success  Alcohol intake Limit to no more than 7/week Continue to monitor  Low Back pain Continue IBU PRN Rest when flared   Notify office for further evaluation and treatment, questions or concerns if any reported s/s fail to improve.   The patient was advised to call back or seek an in-person evaluation if any symptoms worsen or if the condition fails to improve as anticipated.   Further disposition pending results of labs. Discussed  med's effects and SE's.    I discussed the assessment and treatment plan with the patient. The patient was provided an opportunity to ask questions and all were answered. The patient agreed with the plan and demonstrated an understanding of the instructions.  Discussed med's effects and SE's. Screening labs and tests as requested with regular follow-up as recommended.  I provided 25 minutes of face-to-face time during this encounter including counseling, chart review, and critical decision making was preformed.  Future Appointments  Date Time Provider Department Center  10/31/2022  9:30 AM Adela Glimpse, NP [facility] None  05/04/2023  9:00 AM Adela Glimpse, NP [facility] None    HPI  47 y.o. female  presents for a follow up for has Hypothyroid; Recurrent major depression in partial remission (HCC); Vitamin D deficiency; Medication management; Anxiety; Light cigarette smoker (1-9 cigarettes per day); B12 deficiency; and Hyperlipidemia on their problem list.   She is married, 2 kids. Works from home, [employer], enjoys her job. Has no new concerns to report in clinic today.  She has hx of depression and anxiety, taking sertraline 50 mg daily working well, will take xanax occ, estimates 1-2 times per week.   She is very light intermittent smoker, 2-3 cigs currently, smoking intermittent since she was 16. Discussed risks associated with smoking, patient is not ready to quit.   BMI is There is no height or weight on file to calculate BMI., she has not been working on diet and exercise.  Smoking and drinking contribute to weight gain along with holidays.   Wt Readings from Last 3 Encounters:  05/03/22 159 lb (72.1 kg)  10/12/21 150 lb (68 kg)  04/14/21 148 lb (67.1 kg)   Her blood pressure has been controlled at home, today their BP is   She does not workout. She denies chest pain, shortness of breath,  dizziness.   BP Readings from Last 3 Encounters:  05/03/22 128/80  10/12/21 120/60   04/14/21 110/72   She is not on cholesterol medication and denies myalgias, no CVA/MI family history. Her cholesterol is at goal. The cholesterol last visit was:   Lab Results  Component Value Date   CHOL 193 05/03/2022   HDL 58 05/03/2022   LDLCALC 117 (H) 05/03/2022   TRIG 82 05/03/2022   CHOLHDL 3.3 05/03/2022   . Last A1C in the office was:   Lab Results  Component Value Date   HGBA1C 5.5 05/03/2022    Last GFR:  Lab Results  Component Value Date   GFRNONAA 79 04/14/2020   Patient is on Vitamin D supplement, taking 5000 IU most days - was not at goal last OV 04/2022 Lab Results  Component Value Date   VD25OH 28 (L) 05/03/2022     She is on thyroid medication, taking it in the AM, waiting an hour. Her medication was not changed last visit, never returned for recheck Lab Results  Component Value Date   TSH 3.65 05/03/2022   She is not currently on supplement for B12,  Lab Results  Component Value Date   VITAMINB12 275 04/14/2020     Current Medications:  Current Outpatient Medications on File Prior to Visit  Medication Sig Dispense Refill   ALPRAZolam (XANAX) 0.5 MG tablet TAKE 1/2 TO 1 TABLET ONE TO TWO TIMES PER DAY ONLY IF NEEDED FOR ANXIETY ATTACK AND TRY TO LIMIT TO 5 DAYS PER WEEK TO AVOID ADDICTION. 40 tablet 0   Cholecalciferol (VITAMIN D3) 5000 units TABS Take 5,000 Units by mouth daily.     ibuprofen (ADVIL) 800 MG tablet Take 1 tablet (800 mg total) by mouth every 8 (eight) hours as needed. 60 tablet 1   levonorgestrel-ethinyl estradiol (KURVELO) 0.15-30 MG-MCG tablet Take 1 tablet Daily 84 tablet 3   levothyroxine (EUTHYROX) 175 MCG tablet Take 1 tablet daily on an empty stomach with only water for 30 minutes & no Antacid meds, Calcium or Magnesium for 4 hours & avoid Biotin 90 tablet 3   Methylcobalamin (B12 SL) Place 500 mcg under the tongue daily. (Patient not taking: Reported on 05/03/2022)     sertraline (ZOLOFT) 50 MG tablet Take 1 tablet daily for  mood. 90 tablet 3   No current facility-administered medications on file prior to visit.   Allergies:  No Known Allergies   Medical History:  She has Hypothyroid; Recurrent major depression in partial remission (HCC); Vitamin D deficiency; Medication management; Anxiety; Light cigarette smoker (1-9 cigarettes per day); B12 deficiency; and Hyperlipidemia on their problem list.   Health Maintenance:   Immunization History  Administered Date(s) Administered   Influenza Inj Mdck Quad With Preservative 03/07/2018   Influenza, Seasonal, Injecte, Preservative Fre 04/13/2016   Influenza-Unspecified 05/31/2017   MMR 05/14/2014    No LMP recorded. Monthly. regular Pap: 07/2021  at GYN, remote hx of abnormal   Patient Care Team: Lucky Cowboy, MD as PCP - General (Internal Medicine) Marcelle Overlie, MD as Consulting Physician (Obstetrics and Gynecology)  Surgical History:  She has a past surgical history that includes Heart chamber revision (1979) and Wisdom tooth extraction (1999). Family History:  Herfamily history includes COPD in her paternal grandmother; Diabetes type II in her maternal aunt; Hyperlipidemia in her father and mother; Lung cancer in her maternal grandmother. Social History:  She reports that she has been smoking cigarettes. She started smoking about 31 years ago. She  has a 6.3 pack-year smoking history. She has never used smokeless tobacco. She reports current alcohol use of about 14.0 standard drinks of alcohol per week. She reports that she does not use drugs.  Review of Systems: Review of Systems  Constitutional:  Negative for malaise/fatigue and weight loss.  HENT:  Negative for hearing loss and tinnitus.   Eyes:  Negative for blurred vision and double vision.  Respiratory:  Negative for cough, shortness of breath and wheezing.   Cardiovascular:  Negative for chest pain, palpitations, orthopnea, claudication and leg swelling.  Gastrointestinal:  Negative for  abdominal pain, blood in stool, constipation, diarrhea, heartburn, melena, nausea and vomiting.  Genitourinary: Negative.   Musculoskeletal:  Negative for joint pain and myalgias.  Skin:  Negative for rash.  Neurological:  Negative for dizziness, tingling, sensory change, weakness and headaches.  Endo/Heme/Allergies:  Negative for polydipsia.  Psychiatric/Behavioral: Negative.    All other systems reviewed and are negative.   Physical Exam: Estimated body mass index is 23.82 kg/m as calculated from the following:   Height as of 05/03/22: 5' 8.5" (1.74 m).   Weight as of 05/03/22: 159 lb (72.1 kg). There were no vitals taken for this visit. General Appearance: Well nourished, in no apparent distress.  Eyes: PERRLA, EOMs, conjunctiva no swelling or erythema Sinuses: No Frontal/maxillary tenderness  ENT/Mouth: Ext aud canals clear, normal light reflex with TMs without erythema, bulging. Good dentition. No erythema, swelling, or exudate on post pharynx. Tonsils not swollen or erythematous. Hearing normal.  Neck: Supple, thyroid normal. No bruits  Respiratory: Respiratory effort normal, BS equal bilaterally without rales, rhonchi, wheezing or stridor.  Cardio: RRR without murmurs, rubs or gallops. Brisk peripheral pulses without edema.  Chest: symmetric, with normal excursions and percussion.  Breasts: defer to GYN, getting mammograms Abdomen: Soft, nontender, no guarding, rebound, hernias, masses, or organomegaly.  Lymphatics: Non tender without lymphadenopathy.  Genitourinary: defer to GYN Musculoskeletal: Full ROM all peripheral extremities,5/5 strength, and normal gait.  Skin: Warm, dry without rashes, lesions, ecchymosis. Neuro: Cranial nerves intact, reflexes equal bilaterally. Normal muscle tone, no cerebellar symptoms. Sensation intact.  Psych: Awake and oriented X 3, normal affect, Insight and Judgment appropriate.    Adela Glimpse, NP 9:25 AM [facility]

## 2022-11-12 ENCOUNTER — Other Ambulatory Visit: Payer: Self-pay | Admitting: Nurse Practitioner

## 2022-11-12 DIAGNOSIS — F3341 Major depressive disorder, recurrent, in partial remission: Secondary | ICD-10-CM

## 2022-11-14 MED ORDER — ALPRAZOLAM 0.5 MG PO TABS: ORAL_TABLET | ORAL | 0 refills | Status: DC

## 2022-12-13 ENCOUNTER — Encounter: Payer: Self-pay | Admitting: Nurse Practitioner

## 2022-12-13 DIAGNOSIS — F419 Anxiety disorder, unspecified: Secondary | ICD-10-CM

## 2022-12-13 DIAGNOSIS — F3341 Major depressive disorder, recurrent, in partial remission: Secondary | ICD-10-CM

## 2022-12-13 MED ORDER — SERTRALINE HCL 50 MG PO TABS: ORAL_TABLET | ORAL | 3 refills | Status: DC

## 2023-02-15 ENCOUNTER — Other Ambulatory Visit: Payer: Self-pay | Admitting: Nurse Practitioner

## 2023-02-15 DIAGNOSIS — F3341 Major depressive disorder, recurrent, in partial remission: Secondary | ICD-10-CM

## 2023-02-15 MED ORDER — ALPRAZOLAM 0.5 MG PO TABS
ORAL_TABLET | ORAL | 0 refills | Status: DC
Start: 2023-02-15 — End: 2023-02-16

## 2023-02-16 ENCOUNTER — Other Ambulatory Visit: Payer: Self-pay | Admitting: Nurse Practitioner

## 2023-02-16 DIAGNOSIS — F3341 Major depressive disorder, recurrent, in partial remission: Secondary | ICD-10-CM

## 2023-02-16 MED ORDER — ALPRAZOLAM 0.5 MG PO TABS: ORAL_TABLET | ORAL | 0 refills | Status: DC

## 2023-04-18 ENCOUNTER — Encounter: Payer: 59 | Admitting: Nurse Practitioner

## 2023-05-03 ENCOUNTER — Other Ambulatory Visit: Payer: Self-pay | Admitting: Nurse Practitioner

## 2023-05-03 DIAGNOSIS — F3341 Major depressive disorder, recurrent, in partial remission: Secondary | ICD-10-CM

## 2023-05-03 MED ORDER — ALPRAZOLAM 0.5 MG PO TABS: ORAL_TABLET | ORAL | 0 refills | Status: DC

## 2023-05-04 ENCOUNTER — Encounter: Payer: 59 | Admitting: Nurse Practitioner

## 2023-05-27 ENCOUNTER — Encounter: Payer: Self-pay | Admitting: Internal Medicine

## 2023-05-27 DIAGNOSIS — M545 Other chronic pain: Secondary | ICD-10-CM

## 2023-05-31 ENCOUNTER — Other Ambulatory Visit: Payer: Self-pay

## 2023-05-31 DIAGNOSIS — M545 Low back pain, unspecified: Secondary | ICD-10-CM

## 2023-05-31 MED ORDER — IBUPROFEN 800 MG PO TABS: 800.0000 mg | ORAL_TABLET | Freq: Three times a day (TID) | ORAL | 1 refills | Status: AC | PRN

## 2023-07-26 ENCOUNTER — Ambulatory Visit (INDEPENDENT_AMBULATORY_CARE_PROVIDER_SITE_OTHER): Payer: 59 | Admitting: Family Medicine

## 2023-07-26 ENCOUNTER — Encounter: Payer: Self-pay | Admitting: Family Medicine

## 2023-07-26 VITALS — BP 118/82 | HR 74 | Temp 98.3°F | Ht 68.5 in | Wt 161.6 lb

## 2023-07-26 DIAGNOSIS — E538 Deficiency of other specified B group vitamins: Secondary | ICD-10-CM

## 2023-07-26 DIAGNOSIS — E039 Hypothyroidism, unspecified: Secondary | ICD-10-CM

## 2023-07-26 DIAGNOSIS — E559 Vitamin D deficiency, unspecified: Secondary | ICD-10-CM

## 2023-07-26 DIAGNOSIS — E782 Mixed hyperlipidemia: Secondary | ICD-10-CM | POA: Diagnosis not present

## 2023-07-26 DIAGNOSIS — F411 Generalized anxiety disorder: Secondary | ICD-10-CM | POA: Insufficient documentation

## 2023-07-26 DIAGNOSIS — Z79899 Other long term (current) drug therapy: Secondary | ICD-10-CM | POA: Diagnosis not present

## 2023-07-26 DIAGNOSIS — Z1211 Encounter for screening for malignant neoplasm of colon: Secondary | ICD-10-CM

## 2023-07-26 DIAGNOSIS — F3341 Major depressive disorder, recurrent, in partial remission: Secondary | ICD-10-CM

## 2023-07-26 MED ORDER — ALPRAZOLAM 0.5 MG PO TABS: ORAL_TABLET | ORAL | 0 refills | Status: DC

## 2023-07-26 NOTE — Progress Notes (Signed)
 New Patient Office Visit  Subjective    Patient ID: Alyssa Skinner, female    DOB: 07/12/1975  Age: 48 y.o. MRN: [id number]  CC:  Chief Complaint  Patient presents with   Establish Care    HPI Alyssa Skinner presents to establish care today. She sees Dr. Reinhold Carbine [facility] for [facility] needs. States that thyroid was checked on 07/12/2023 and TSH was 4.83, has not been taking levothyroxine  for the last week. Reports increased fatigue recently. Requesting refill of Xanax  today, states that she takes 1/2 tablet of 0.5 mg couple of times a week. Due for colon cancer screening, ordered Cologuard today. Receives regular dental and eye care.  Reports compliance with medication regimen.  Denies other concerns today.  Outpatient Encounter Medications as of 07/26/2023  Medication Sig   Cholecalciferol (VITAMIN D3) 5000 units TABS Take 5,000 Units by mouth daily.   ibuprofen  (ADVIL ) 800 MG tablet Take 1 tablet (800 mg total) by mouth every 8 (eight) hours as needed.   levonorgestrel -ethinyl estradiol (KURVELO) 0.15-30 MG-MCG tablet Take 1 tablet Daily   levothyroxine  (EUTHYROX ) 175 MCG tablet Take 1 tablet daily on an empty stomach with only water for 30 minutes & no Antacid meds, Calcium or Magnesium for 4 hours & avoid Biotin   Methylcobalamin (B12 SL) Place 500 mcg under the tongue daily.   sertraline  (ZOLOFT ) 50 MG tablet Take 1 tablet daily for mood.   [DISCONTINUED] ALPRAZolam  (XANAX ) 0.5 MG tablet TAKE 1/2 TO 1 TABLET ONE TO TWO TIMES PER DAY ONLY IF NEEDED FOR ANXIETY ATTACK AND TRY TO LIMIT TO 5 DAYS PER WEEK TO AVOID ADDICTION.   ALPRAZolam  (XANAX ) 0.5 MG tablet TAKE 1/2 TO 1 TABLET ONE TO TWO TIMES PER DAY ONLY IF NEEDED FOR ANXIETY ATTACK AND TRY TO LIMIT TO 5 DAYS PER WEEK TO AVOID ADDICTION.   No facility-administered encounter medications on file as of 07/26/2023.    Past Medical History:  Diagnosis Date   Anxiety    Depression    Hypothyroid    SVD  (spontaneous vaginal delivery) 05/12/2014    Past Surgical History:  Procedure Laterality Date   HEART CHAMBER REVISION  1979   WISDOM TOOTH EXTRACTION  1999    Family History  Problem Relation Age of Onset   Hyperlipidemia Mother    Hyperlipidemia Father    Lung cancer Maternal Grandmother        occupational exposure   COPD Paternal Grandmother        smoker   Diabetes type II Maternal Aunt     Social History   Socioeconomic History   Marital status: Married    Spouse name: Not on file   Number of children: Not on file   Years of education: Not on file   Highest education level: Not on file  Occupational History   Not on file  Tobacco Use   Smoking status: Every Day    Current packs/day: 0.20    Average packs/day: 0.2 packs/day for 32.3 years (6.5 ttl pk-yrs)    Types: Cigarettes    Start date: 58   Smokeless tobacco: Never   Tobacco comments:    light intermittent smoker since age 89, currently 2-3 cigs  Vaping Use   Vaping status: Never Used  Substance and Sexual Activity   Alcohol use: Yes    Alcohol/week: 14.0 standard drinks of alcohol    Types: 14 Standard drinks or equivalent per week    Comment: 1-2 beer per night  Drug use: No   Sexual activity: Yes    Partners: Male    Birth control/protection: Pill  Other Topics Concern   Not on file  Social History Narrative   Not on file   Social Drivers of Health   Financial Resource Strain: Not on file  Food Insecurity: Not on file  Transportation Needs: Not on file  Physical Activity: Not on file  Stress: Not on file  Social Connections: Not on file  Intimate Partner Violence: Not on file    ROS Per HPI      Objective    BP 118/82 (BP Location: Left Arm, Patient Position: Sitting)   Pulse 74   Temp 98.3 F (36.8 C) (Temporal)   Ht 5' 8.5" (1.74 m)   Wt 161 lb 9.6 oz (73.3 kg)   SpO2 100%   BMI 24.21 kg/m   Physical Exam Vitals and nursing note reviewed.  Constitutional:       General: She is not in acute distress.    Appearance: Normal appearance. She is normal weight.  HENT:     Head: Normocephalic and atraumatic.     Mouth/Throat:     Mouth: Mucous membranes are moist.     Pharynx: Oropharynx is clear.  Eyes:     Extraocular Movements: Extraocular movements intact.     Pupils: Pupils are equal, round, and reactive to light.  Cardiovascular:     Rate and Rhythm: Normal rate and regular rhythm.     Pulses: Normal pulses.     Heart sounds: Normal heart sounds. No murmur heard. Pulmonary:     Effort: Pulmonary effort is normal. No respiratory distress.     Breath sounds: Normal breath sounds. No wheezing, rhonchi or rales.  Musculoskeletal:        General: Normal range of motion.     Cervical back: Normal range of motion.     Right lower leg: No edema.     Left lower leg: No edema.  Lymphadenopathy:     Cervical: No cervical adenopathy.  Neurological:     General: No focal deficit present.     Mental Status: She is alert and oriented to person, place, and time.  Psychiatric:        Mood and Affect: Mood normal.        Thought Content: Thought content normal.         Assessment & Plan:   Acquired hypothyroidism Assessment & Plan: Restart levothyroxine , will recheck lab at next visit   Vitamin D  deficiency Assessment & Plan: Continue vit d supplementation, will check levels at next visit   Medication management Assessment & Plan: Cologuard ordered today   Mixed hyperlipidemia Assessment & Plan: Cont efforts in low fat diet and healthy activity level   B12 deficiency Assessment & Plan: Cont supplementation, will check levels at next visit   GAD (generalized anxiety disorder) Assessment & Plan: Controlled, refilled xanax  prn today   Recurrent major depressive disorder, in partial remission (HCC) -     ALPRAZolam ; TAKE 1/2 TO 1 TABLET ONE TO TWO TIMES PER DAY ONLY IF NEEDED FOR ANXIETY ATTACK AND TRY TO LIMIT TO 5 DAYS PER  WEEK TO AVOID ADDICTION.  Dispense: 40 tablet; Refill: 0  Colon cancer screening Assessment & Plan: Cologuard ordered today  Orders: -     Cologuard     Return in about 8 weeks (around 09/20/2023) for recheck thyroid.   Wellington Half, FNP

## 2023-07-26 NOTE — Assessment & Plan Note (Signed)
 Restart levothyroxine , will recheck lab at next visit

## 2023-07-26 NOTE — Assessment & Plan Note (Signed)
 Cologuard ordered today

## 2023-07-26 NOTE — Assessment & Plan Note (Signed)
 Cont efforts in low fat diet and healthy activity level

## 2023-07-26 NOTE — Assessment & Plan Note (Signed)
 Controlled, refilled xanax  prn today

## 2023-07-26 NOTE — Assessment & Plan Note (Addendum)
 Cont supplementation, will check levels at next visit

## 2023-07-26 NOTE — Assessment & Plan Note (Signed)
 Continue vit d supplementation, will check levels at next visit

## 2023-07-26 NOTE — Patient Instructions (Addendum)
 Welcome to [facility]!  Thank you for choosing us  for your Primary Care needs.   We offer in person and video appointments for your convenience. You may call our office to schedule appointments, or you may schedule appointments with me through MyChart.   The best way to get in contact with me is via MyChart message. This will get to me faster than a phone call, unless there is an emergency, then please call 911.  The lab is located downstairs in the Sports Medicine building, we also have xray available there.   Follow up with me in about 8 weeks for labs and medication management

## 2023-08-06 ENCOUNTER — Encounter: Payer: Self-pay | Admitting: Family Medicine

## 2023-08-07 ENCOUNTER — Other Ambulatory Visit: Payer: Self-pay

## 2023-08-07 DIAGNOSIS — E039 Hypothyroidism, unspecified: Secondary | ICD-10-CM

## 2023-08-07 MED ORDER — LEVOTHYROXINE SODIUM 175 MCG PO TABS
ORAL_TABLET | ORAL | 3 refills | Status: DC
Start: 2023-08-07 — End: 2023-12-14

## 2023-09-16 NOTE — Progress Notes (Deleted)
   Established Patient Office Visit  Subjective   Patient ID: Alyssa Skinner, female    DOB: 11-16-1975  Age: 48 y.o. MRN: [id number]  No chief complaint on file.   HPI Patient presents today for medication management. Reports compliance with medication regimen. Denies the need for refills. Not fasting today. Denies other concerns. Medical history as outlined below.  ROS Per HPI    Objective:     There were no vitals taken for this visit.  Physical Exam Vitals and nursing note reviewed.  Constitutional:      General: She is not in acute distress.    Appearance: Normal appearance. She is normal weight.  HENT:     Head: Normocephalic and atraumatic.     Mouth/Throat:     Mouth: Mucous membranes are moist.     Pharynx: Oropharynx is clear.   Eyes:     Extraocular Movements: Extraocular movements intact.     Pupils: Pupils are equal, round, and reactive to light.    Cardiovascular:     Rate and Rhythm: Normal rate and regular rhythm.     Pulses: Normal pulses.     Heart sounds: Normal heart sounds. No murmur heard. Pulmonary:     Effort: Pulmonary effort is normal. No respiratory distress.     Breath sounds: Normal breath sounds. No wheezing, rhonchi or rales.   Musculoskeletal:        General: Normal range of motion.     Cervical back: Normal range of motion.     Right lower leg: No edema.     Left lower leg: No edema.  Lymphadenopathy:     Cervical: No cervical adenopathy.   Neurological:     General: No focal deficit present.     Mental Status: She is alert and oriented to person, place, and time.   Psychiatric:        Mood and Affect: Mood normal.        Thought Content: Thought content normal.      No results found for any visits on 09/25/23.   The 10-year ASCVD risk score (Arnett DK, et al., 2019) is: 2.2%    Assessment & Plan:   There are no diagnoses linked to this encounter.   No follow-ups on file.    Wellington Half, FNP

## 2023-09-25 ENCOUNTER — Ambulatory Visit: Admitting: Family Medicine

## 2023-09-25 DIAGNOSIS — Z79899 Other long term (current) drug therapy: Secondary | ICD-10-CM

## 2023-09-25 DIAGNOSIS — E039 Hypothyroidism, unspecified: Secondary | ICD-10-CM

## 2023-09-25 DIAGNOSIS — E538 Deficiency of other specified B group vitamins: Secondary | ICD-10-CM

## 2023-09-25 DIAGNOSIS — F411 Generalized anxiety disorder: Secondary | ICD-10-CM

## 2023-09-25 DIAGNOSIS — E559 Vitamin D deficiency, unspecified: Secondary | ICD-10-CM

## 2023-09-25 DIAGNOSIS — F3341 Major depressive disorder, recurrent, in partial remission: Secondary | ICD-10-CM

## 2023-10-20 ENCOUNTER — Encounter: Payer: Self-pay | Admitting: Family Medicine

## 2023-10-23 ENCOUNTER — Other Ambulatory Visit: Payer: Self-pay | Admitting: Family Medicine

## 2023-10-23 DIAGNOSIS — F3341 Major depressive disorder, recurrent, in partial remission: Secondary | ICD-10-CM

## 2023-10-23 MED ORDER — ALPRAZOLAM 0.5 MG PO TABS: ORAL_TABLET | ORAL | 0 refills | Status: DC

## 2023-10-26 ENCOUNTER — Ambulatory Visit: Admitting: Family Medicine

## 2023-11-07 NOTE — Progress Notes (Deleted)
 Established Patient Office Visit  Subjective:     Patient ID: Alyssa Skinner, female    DOB: Mar 06, 1976, 48 y.o.   MRN: [id number]  No chief complaint on file.   HPI  Discussed the use of AI scribe software for clinical note transcription with the patient, who gave verbal consent to proceed.  History of Present Illness      ROS Per HPI      Objective:    There were no vitals taken for this visit.   Physical Exam Vitals and nursing note reviewed.  Constitutional:      General: She is not in acute distress.    Appearance: Normal appearance. She is normal weight.  HENT:     Head: Normocephalic and atraumatic.     Right Ear: External ear normal.     Left Ear: External ear normal.     Nose: Nose normal.     Mouth/Throat:     Mouth: Mucous membranes are moist.     Pharynx: Oropharynx is clear.  Eyes:     Extraocular Movements: Extraocular movements intact.     Pupils: Pupils are equal, round, and reactive to light.  Cardiovascular:     Rate and Rhythm: Normal rate and regular rhythm.     Pulses: Normal pulses.     Heart sounds: Normal heart sounds.  Pulmonary:     Effort: Pulmonary effort is normal. No respiratory distress.     Breath sounds: Normal breath sounds. No wheezing, rhonchi or rales.  Musculoskeletal:        General: Normal range of motion.     Cervical back: Normal range of motion.     Right lower leg: No edema.     Left lower leg: No edema.  Lymphadenopathy:     Cervical: No cervical adenopathy.  Neurological:     General: No focal deficit present.     Mental Status: She is alert and oriented to person, place, and time.  Psychiatric:        Mood and Affect: Mood normal.        Thought Content: Thought content normal.     No results found for any visits on 11/09/23.  The 10-year ASCVD risk score (Arnett DK, et al., 2019) is: 2.2%  BP Readings from Last 3 Encounters:  07/26/23 118/82  05/03/22 128/80  10/12/21 120/60   Wt Readings from  Last 3 Encounters:  07/26/23 161 lb 9.6 oz (73.3 kg)  05/03/22 159 lb (72.1 kg)  10/12/21 150 lb (68 kg)      Last CBC Lab Results  Component Value Date   WBC 7.2 05/03/2022   HGB 13.0 05/03/2022   HCT 37.9 05/03/2022   MCV 87.3 05/03/2022   MCH 30.0 05/03/2022   RDW 12.2 05/03/2022   PLT 264 05/03/2022   Last metabolic panel Lab Results  Component Value Date   GLUCOSE 93 05/03/2022   NA 138 05/03/2022   K 4.4 05/03/2022   CL 104 05/03/2022   CO2 26 05/03/2022   BUN 12 05/03/2022   CREATININE 0.72 05/03/2022   EGFR 105 05/03/2022   CALCIUM 9.4 05/03/2022   PROT 7.0 05/03/2022   ALBUMIN 4.2 10/26/2016   BILITOT 0.5 05/03/2022   ALKPHOS 56 10/26/2016   AST 15 05/03/2022   ALT 22 05/03/2022   Last lipids Lab Results  Component Value Date   CHOL 193 05/03/2022   HDL 58 05/03/2022   LDLCALC 117 (H) 05/03/2022   TRIG 82 05/03/2022  CHOLHDL 3.3 05/03/2022   Last hemoglobin A1c Lab Results  Component Value Date   HGBA1C 5.5 05/03/2022   Last vitamin D  Lab Results  Component Value Date   VD25OH 28 (L) 05/03/2022   Last vitamin B12 and Folate Lab Results  Component Value Date   VITAMINB12 275 04/14/2020         Assessment & Plan:   Assessment and Plan Assessment & Plan      No orders of the defined types were placed in this encounter.    No orders of the defined types were placed in this encounter.   No follow-ups on file.  [name], FNP

## 2023-11-09 ENCOUNTER — Ambulatory Visit: Admitting: Family Medicine

## 2023-11-09 DIAGNOSIS — F411 Generalized anxiety disorder: Secondary | ICD-10-CM

## 2023-11-09 DIAGNOSIS — E538 Deficiency of other specified B group vitamins: Secondary | ICD-10-CM

## 2023-11-09 DIAGNOSIS — F3341 Major depressive disorder, recurrent, in partial remission: Secondary | ICD-10-CM

## 2023-11-09 DIAGNOSIS — E039 Hypothyroidism, unspecified: Secondary | ICD-10-CM

## 2023-11-09 DIAGNOSIS — E782 Mixed hyperlipidemia: Secondary | ICD-10-CM

## 2023-11-09 DIAGNOSIS — Z79899 Other long term (current) drug therapy: Secondary | ICD-10-CM

## 2023-11-09 DIAGNOSIS — E559 Vitamin D deficiency, unspecified: Secondary | ICD-10-CM

## 2023-11-21 ENCOUNTER — Ambulatory Visit: Admitting: Family Medicine

## 2023-12-05 ENCOUNTER — Ambulatory Visit: Admitting: Family Medicine

## 2023-12-07 ENCOUNTER — Ambulatory Visit: Admitting: Family Medicine

## 2023-12-08 ENCOUNTER — Ambulatory Visit: Admitting: Family Medicine

## 2023-12-12 ENCOUNTER — Encounter: Payer: Self-pay | Admitting: Family Medicine

## 2023-12-12 ENCOUNTER — Ambulatory Visit: Admitting: Family Medicine

## 2023-12-12 VITALS — BP 126/80 | HR 71 | Temp 98.1°F | Ht 68.5 in | Wt 157.0 lb

## 2023-12-12 DIAGNOSIS — F411 Generalized anxiety disorder: Secondary | ICD-10-CM

## 2023-12-12 DIAGNOSIS — E039 Hypothyroidism, unspecified: Secondary | ICD-10-CM

## 2023-12-12 DIAGNOSIS — Z6823 Body mass index (BMI) 23.0-23.9, adult: Secondary | ICD-10-CM

## 2023-12-12 DIAGNOSIS — E782 Mixed hyperlipidemia: Secondary | ICD-10-CM | POA: Diagnosis not present

## 2023-12-12 DIAGNOSIS — F3341 Major depressive disorder, recurrent, in partial remission: Secondary | ICD-10-CM

## 2023-12-12 DIAGNOSIS — E538 Deficiency of other specified B group vitamins: Secondary | ICD-10-CM

## 2023-12-12 DIAGNOSIS — Z79899 Other long term (current) drug therapy: Secondary | ICD-10-CM | POA: Diagnosis not present

## 2023-12-12 DIAGNOSIS — E559 Vitamin D deficiency, unspecified: Secondary | ICD-10-CM | POA: Diagnosis not present

## 2023-12-12 LAB — CBC WITH DIFFERENTIAL/PLATELET
Basophils Absolute: 0 K/uL (ref 0.0–0.1)
Basophils Relative: 0.3 % (ref 0.0–3.0)
Eosinophils Absolute: 0.3 K/uL (ref 0.0–0.7)
Eosinophils Relative: 4.4 % (ref 0.0–5.0)
HCT: 40.7 % (ref 36.0–46.0)
Hemoglobin: 13.8 g/dL (ref 12.0–15.0)
Lymphocytes Relative: 28.1 % (ref 12.0–46.0)
Lymphs Abs: 2.1 K/uL (ref 0.7–4.0)
MCHC: 34.1 g/dL (ref 30.0–36.0)
MCV: 89 fl (ref 78.0–100.0)
Monocytes Absolute: 0.5 K/uL (ref 0.1–1.0)
Monocytes Relative: 7.3 % (ref 3.0–12.0)
Neutro Abs: 4.5 K/uL (ref 1.4–7.7)
Neutrophils Relative %: 59.9 % (ref 43.0–77.0)
Platelets: 268 K/uL (ref 150.0–400.0)
RBC: 4.57 Mil/uL (ref 3.87–5.11)
RDW: 13.5 % (ref 11.5–15.5)
WBC: 7.5 K/uL (ref 4.0–10.5)

## 2023-12-12 LAB — COMPREHENSIVE METABOLIC PANEL WITH GFR
ALT: 17 U/L (ref 0–35)
AST: 12 U/L (ref 0–37)
Albumin: 4.2 g/dL (ref 3.5–5.2)
Alkaline Phosphatase: 49 U/L (ref 39–117)
BUN: 9 mg/dL (ref 6–23)
CO2: 25 meq/L (ref 19–32)
Calcium: 9.5 mg/dL (ref 8.4–10.5)
Chloride: 103 meq/L (ref 96–112)
Creatinine, Ser: 0.73 mg/dL (ref 0.40–1.20)
GFR: 98.45 mL/min (ref >60.00)
Glucose, Bld: 85 mg/dL (ref 70–99)
Potassium: 4.2 meq/L (ref 3.5–5.1)
Sodium: 136 meq/L (ref 135–145)
Total Bilirubin: 0.6 mg/dL (ref 0.2–1.2)
Total Protein: 7 g/dL (ref 6.0–8.3)

## 2023-12-12 LAB — LIPID PANEL
Cholesterol: 169 mg/dL (ref 0–200)
HDL: 41.2 mg/dL (ref >39.00)
LDL Cholesterol: 108 mg/dL — ABNORMAL HIGH (ref 0–99)
NonHDL: 128.04
Total CHOL/HDL Ratio: 4
Triglycerides: 102 mg/dL (ref 0.0–149.0)
VLDL: 20.4 mg/dL (ref 0.0–40.0)

## 2023-12-12 LAB — VITAMIN D 25 HYDROXY (VIT D DEFICIENCY, FRACTURES): VITD: 32.75 ng/mL (ref 30.00–100.00)

## 2023-12-12 LAB — VITAMIN B12: Vitamin B-12: 175 pg/mL — ABNORMAL LOW (ref 211–911)

## 2023-12-12 LAB — TSH: TSH: 0.31 u[IU]/mL — ABNORMAL LOW (ref 0.35–5.50)

## 2023-12-12 MED ORDER — LEVONORGESTREL-ETHINYL ESTRAD 0.15-30 MG-MCG PO TABS: ORAL_TABLET | ORAL | 3 refills | Status: AC

## 2023-12-12 MED ORDER — SERTRALINE HCL 50 MG PO TABS: ORAL_TABLET | ORAL | 3 refills | Status: AC

## 2023-12-12 MED ORDER — ALPRAZOLAM 0.5 MG PO TABS: ORAL_TABLET | ORAL | 0 refills | Status: DC

## 2023-12-12 NOTE — Progress Notes (Signed)
 Established Patient Office Visit  Subjective:     Patient ID: Alyssa Skinner, female    DOB: 08/21/1975, 48 y.o.   MRN: [id number]  Chief Complaint  Patient presents with   Follow-up    HPI  Discussed the use of AI scribe software for clinical note transcription with the patient, who gave verbal consent to proceed.  History of Present Illness Alyssa Skinner is a 48 year old female who presents for medication refills and lab work.  Medication management - Here for follow-up to refill alprazolam  (Xanax ).  Laboratory evaluation - Fasting for laboratory work today.  Colorectal cancer screening - Completed Cologuard test last Thursday; awaiting results. - Apprehensive about the possibility of needing a colonoscopy.  Thyroid medication use - Taking thyroid medication with good management of symptoms. - Occasionally takes thyroid medication at night if she wakes up to urinate.  Urinary frequency - Experiences occasional urinary frequency without dysuria or hematuria. - Suspects incomplete bladder emptying as a possible cause.     ROS Per HPI      Objective:    BP 126/80 (BP Location: Left Arm, Patient Position: Sitting)   Pulse 71   Temp 98.1 F (36.7 C) (Temporal)   Ht 5' 8.5 (1.74 m)   Wt 157 lb (71.2 kg)   SpO2 100%   BMI 23.52 kg/m    Physical Exam Vitals and nursing note reviewed.  Constitutional:      General: She is not in acute distress.    Appearance: Normal appearance. She is normal weight.  HENT:     Head: Normocephalic and atraumatic.     Right Ear: External ear normal.     Left Ear: External ear normal.     Nose: Nose normal.     Mouth/Throat:     Mouth: Mucous membranes are moist.     Pharynx: Oropharynx is clear.  Eyes:     Extraocular Movements: Extraocular movements intact.     Pupils: Pupils are equal, round, and reactive to light.  Cardiovascular:     Rate and Rhythm: Normal rate and regular rhythm.     Pulses: Normal pulses.      Heart sounds: Normal heart sounds.  Pulmonary:     Effort: Pulmonary effort is normal. No respiratory distress.     Breath sounds: Normal breath sounds. No wheezing, rhonchi or rales.  Musculoskeletal:        General: Normal range of motion.     Cervical back: Normal range of motion.     Right lower leg: No edema.     Left lower leg: No edema.  Lymphadenopathy:     Cervical: No cervical adenopathy.  Neurological:     General: No focal deficit present.     Mental Status: She is alert and oriented to person, place, and time.  Psychiatric:        Mood and Affect: Mood normal.        Thought Content: Thought content normal.     Results for orders placed or performed in visit on 12/12/23  CBC w/Diff  Result Value Ref Range   WBC 7.5 4.0 - 10.5 K/uL   RBC 4.57 3.87 - 5.11 Mil/uL   Hemoglobin 13.8 12.0 - 15.0 g/dL   HCT 59.2 63.9 - 53.9 %   MCV 89.0 78.0 - 100.0 fl   MCHC 34.1 30.0 - 36.0 g/dL   RDW 86.4 88.4 - 84.4 %   Platelets 268.0 150.0 - 400.0 K/uL   Neutrophils Relative %  59.9 43.0 - 77.0 %   Lymphocytes Relative 28.1 12.0 - 46.0 %   Monocytes Relative 7.3 3.0 - 12.0 %   Eosinophils Relative 4.4 0.0 - 5.0 %   Basophils Relative 0.3 0.0 - 3.0 %   Neutro Abs 4.5 1.4 - 7.7 K/uL   Lymphs Abs 2.1 0.7 - 4.0 K/uL   Monocytes Absolute 0.5 0.1 - 1.0 K/uL   Eosinophils Absolute 0.3 0.0 - 0.7 K/uL   Basophils Absolute 0.0 0.0 - 0.1 K/uL  Comp Met (CMET)  Result Value Ref Range   Sodium 136 135 - 145 mEq/L   Potassium 4.2 3.5 - 5.1 mEq/L   Chloride 103 96 - 112 mEq/L   CO2 25 19 - 32 mEq/L   Glucose, Bld 85 70 - 99 mg/dL   BUN 9 6 - 23 mg/dL   Creatinine, Ser 9.26 0.40 - 1.20 mg/dL   Total Bilirubin 0.6 0.2 - 1.2 mg/dL   Alkaline Phosphatase 49 39 - 117 U/L   AST 12 0 - 37 U/L   ALT 17 0 - 35 U/L   Total Protein 7.0 6.0 - 8.3 g/dL   Albumin 4.2 3.5 - 5.2 g/dL   GFR 01.54 >39.99 mL/min   Calcium 9.5 8.4 - 10.5 mg/dL  TSH  Result Value Ref Range   TSH 0.31 (L) 0.35 -  5.50 uIU/mL  VITAMIN D  25 Hydroxy (Vit-D Deficiency, Fractures)  Result Value Ref Range   VITD 32.75 30.00 - 100.00 ng/mL  Vitamin B12  Result Value Ref Range   Vitamin B-12 175 (L) 211 - 911 pg/mL  Lipid panel  Result Value Ref Range   Cholesterol 169 0 - 200 mg/dL   Triglycerides 897.9 0.0 - 149.0 mg/dL   HDL 58.79 >60.99 mg/dL   VLDL 79.5 0.0 - 59.9 mg/dL   LDL Cholesterol 891 (H) 0 - 99 mg/dL   Total CHOL/HDL Ratio 4    NonHDL 128.04     The 10-year ASCVD risk score (Arnett DK, et al., 2019) is: 3.3%           Assessment & Plan:   Assessment and Plan Assessment & Plan Generalized anxiety disorder and major depressive disorder, recurrent, partial remission Managed with sertraline  and alprazolam . Partial remission for major depressive disorder. Discussed prescription labeling to prevent pharmacy errors. - Refill alprazolam  prescription. - Ensure prescriptions are correctly labeled.  Acquired hypothyroidism Managed with levothyroxine . Advised on timing of medication to prevent absorption issues. - Continue levothyroxine  175 mcg daily.  General Health Maintenance Completed Cologuard test. Discussed tetanus vaccination importance. She agreed to vaccination. - Await Cologuard test results. - Continue current medication regimen  Vitamin Deficiencies - B12 level today - D level today  HLD - lipids today  BMI 23 - Continue efforts in healthy diet and activity level     Orders Placed This Encounter  Procedures   CBC w/Diff   Comp Met (CMET)   TSH   Lipid panel    Standing Status:   Future    Number of Occurrences:   1    Expiration Date:   12/11/2024   VITAMIN D  25 Hydroxy (Vit-D Deficiency, Fractures)   Vitamin B12     Meds ordered this encounter  Medications   ALPRAZolam  (XANAX ) 0.5 MG tablet    Sig: TAKE 1/2 TO 1 TABLET ONE TO TWO TIMES PER DAY ONLY IF NEEDED FOR ANXIETY ATTACK AND TRY TO LIMIT TO 5 DAYS PER WEEK TO AVOID ADDICTION.    Dispense:  40  tablet    Refill:  0   levonorgestrel -ethinyl estradiol (KURVELO) 0.15-30 MG-MCG tablet    Sig: Take 1 tablet Daily    Dispense:  84 tablet    Refill:  3   sertraline  (ZOLOFT ) 50 MG tablet    Sig: Take 1 tablet daily for mood.    Dispense:  90 tablet    Refill:  3    Return in about 6 months (around 06/10/2024) for meds.  [name], FNP

## 2023-12-12 NOTE — Patient Instructions (Signed)
 We are checking labs today, will be in contact with any results that require further attention  Follow-up with me in 6 mos for medication management, sooner if needed.

## 2023-12-14 ENCOUNTER — Ambulatory Visit: Payer: Self-pay | Admitting: Family Medicine

## 2023-12-14 LAB — COLOGUARD: COLOGUARD: NEGATIVE

## 2023-12-14 MED ORDER — LEVOTHYROXINE SODIUM 150 MCG PO TABS: 150.0000 ug | ORAL_TABLET | Freq: Every day | ORAL | 1 refills | Status: DC

## 2024-02-09 ENCOUNTER — Encounter: Payer: Self-pay | Admitting: Family Medicine

## 2024-02-09 ENCOUNTER — Ambulatory Visit (INDEPENDENT_AMBULATORY_CARE_PROVIDER_SITE_OTHER): Admitting: Family Medicine

## 2024-02-09 ENCOUNTER — Ambulatory Visit: Payer: Self-pay | Admitting: Family Medicine

## 2024-02-09 VITALS — BP 118/74 | HR 68 | Temp 98.4°F | Ht 68.5 in | Wt 157.8 lb

## 2024-02-09 DIAGNOSIS — F411 Generalized anxiety disorder: Secondary | ICD-10-CM

## 2024-02-09 DIAGNOSIS — E039 Hypothyroidism, unspecified: Secondary | ICD-10-CM

## 2024-02-09 LAB — TSH: TSH: 1.32 u[IU]/mL (ref 0.35–5.50)

## 2024-02-09 NOTE — Progress Notes (Signed)
 Established Patient Office Visit  Subjective:     Patient ID: Alyssa Skinner, female    DOB: Sep 02, 1975, 48 y.o.   MRN: [id number]  Chief Complaint  Patient presents with   Follow-up    HPI  Discussed the use of AI scribe software for clinical note transcription with the patient, who gave verbal consent to proceed.  History of Present Illness Alyssa Skinner is a 47 year old female who presents for a recheck of her thyroid and to discuss recent abdominal discomfort.  Abdominal discomfort - Bloating and cramping localized to the upper abdomen - Associated soreness and decreased appetite - Onset on a Friday, with worsening over the weekend - Improvement with onset of menstrual cycle - No sharp pain or fever during the episode - Typically does not experience cramping, making this episode atypical  Anxiety symptoms - Increased anxiety associated with husband's absence and son's travel - Uses Xanax  sparingly, taking a mild dose as needed     ROS Per HPI      Objective:    BP 118/74 (BP Location: Left Arm, Patient Position: Sitting)   Pulse 68   Temp 98.4 F (36.9 C) (Temporal)   Ht 5' 8.5 (1.74 m)   Wt 157 lb 12.8 oz (71.6 kg)   LMP 02/04/2024 (Exact Date)   SpO2 100%   Breastfeeding No   BMI 23.64 kg/m    Physical Exam Vitals and nursing note reviewed.  Constitutional:      General: She is not in acute distress.    Appearance: Normal appearance. She is normal weight.  HENT:     Head: Normocephalic and atraumatic.     Right Ear: External ear normal.     Left Ear: External ear normal.     Nose: Nose normal.     Mouth/Throat:     Mouth: Mucous membranes are moist.     Pharynx: Oropharynx is clear.  Eyes:     Extraocular Movements: Extraocular movements intact.     Pupils: Pupils are equal, round, and reactive to light.  Cardiovascular:     Rate and Rhythm: Normal rate and regular rhythm.     Pulses: Normal pulses.     Heart sounds: Normal heart sounds.   Pulmonary:     Effort: Pulmonary effort is normal. No respiratory distress.     Breath sounds: Normal breath sounds. No wheezing, rhonchi or rales.  Abdominal:     General: Bowel sounds are normal. There is no distension.     Palpations: Abdomen is soft. There is no mass.     Tenderness: There is no abdominal tenderness. There is no guarding or rebound.     Hernia: No hernia is present.  Musculoskeletal:        General: Normal range of motion.     Cervical back: Normal range of motion.     Right lower leg: No edema.     Left lower leg: No edema.  Lymphadenopathy:     Cervical: No cervical adenopathy.  Neurological:     General: No focal deficit present.     Mental Status: She is alert and oriented to person, place, and time.  Psychiatric:        Mood and Affect: Mood normal.        Thought Content: Thought content normal.     No results found for any visits on 02/09/24.  The 10-year ASCVD risk score (Arnett DK, et al., 2019) is: 2.9%  BP Readings from Last 3  Encounters:  02/09/24 118/74  12/12/23 126/80  07/26/23 118/82   Wt Readings from Last 3 Encounters:  02/09/24 157 lb 12.8 oz (71.6 kg)  12/12/23 157 lb (71.2 kg)  07/26/23 161 lb 9.6 oz (73.3 kg)      Last CBC Lab Results  Component Value Date   WBC 7.5 12/12/2023   HGB 13.8 12/12/2023   HCT 40.7 12/12/2023   MCV 89.0 12/12/2023   MCH 30.0 05/03/2022   RDW 13.5 12/12/2023   PLT 268.0 12/12/2023   Last metabolic panel Lab Results  Component Value Date   GLUCOSE 85 12/12/2023   NA 136 12/12/2023   K 4.2 12/12/2023   CL 103 12/12/2023   CO2 25 12/12/2023   BUN 9 12/12/2023   CREATININE 0.73 12/12/2023   GFR 98.45 12/12/2023   CALCIUM 9.5 12/12/2023   PROT 7.0 12/12/2023   ALBUMIN 4.2 12/12/2023   BILITOT 0.6 12/12/2023   ALKPHOS 49 12/12/2023   AST 12 12/12/2023   ALT 17 12/12/2023   Last thyroid functions Lab Results  Component Value Date   TSH 0.31 (L) 12/12/2023         Assessment &  Plan:   Assessment and Plan Assessment & Plan Acquired hypothyroidism Rechecked thyroid function due to previous levothyroxine  dosage adjustments. - Rechecked thyroid function tests today. - Recheck thyroid function in six months unless symptoms change.  Generalized anxiety disorder Recent bloating and discomfort likely related to anxiety and menstrual cycle. Symptoms improved with routine. - Continue alprazolam  as needed for anxiety attacks. - Monitor symptoms and report if she recurs or worsens.     Orders Placed This Encounter  Procedures   TSH     No orders of the defined types were placed in this encounter.   Return in about 6 months (around 08/08/2024) for Thyroid OV.  [name], FNP

## 2024-02-09 NOTE — Patient Instructions (Addendum)
 We are checking labs today, will be in contact with any results that require further attention.  Follow up with me in about 6 months for labs and medication management, sooner if needed.

## 2024-03-03 ENCOUNTER — Encounter: Payer: Self-pay | Admitting: Family Medicine

## 2024-03-03 ENCOUNTER — Other Ambulatory Visit: Payer: Self-pay | Admitting: Family Medicine

## 2024-03-03 DIAGNOSIS — F3341 Major depressive disorder, recurrent, in partial remission: Secondary | ICD-10-CM

## 2024-04-02 ENCOUNTER — Ambulatory Visit

## 2024-04-02 ENCOUNTER — Ambulatory Visit: Admitting: Family Medicine

## 2024-04-02 ENCOUNTER — Encounter: Payer: Self-pay | Admitting: Family Medicine

## 2024-04-02 VITALS — BP 160/100 | HR 103 | Temp 97.9°F | Ht 68.5 in | Wt 155.0 lb

## 2024-04-02 DIAGNOSIS — M4316 Spondylolisthesis, lumbar region: Secondary | ICD-10-CM

## 2024-04-02 DIAGNOSIS — M5442 Lumbago with sciatica, left side: Secondary | ICD-10-CM

## 2024-04-02 DIAGNOSIS — M51362 Other intervertebral disc degeneration, lumbar region with discogenic back pain and lower extremity pain: Secondary | ICD-10-CM

## 2024-04-02 DIAGNOSIS — M25552 Pain in left hip: Secondary | ICD-10-CM

## 2024-04-02 MED ORDER — KETOROLAC TROMETHAMINE 30 MG/ML IJ SOLN: 30.0000 mg | Freq: Once | INTRAMUSCULAR | Status: DC

## 2024-04-02 MED ORDER — KETOROLAC TROMETHAMINE 30 MG/ML IJ SOLN
60.0000 mg | Freq: Once | INTRAMUSCULAR | Status: AC
  Administered 2024-04-02: 60 mg via INTRAMUSCULAR

## 2024-04-02 MED ORDER — METHYLPREDNISOLONE ACETATE 40 MG/ML IJ SUSP: 40.0000 mg | Freq: Once | INTRAMUSCULAR | Status: DC

## 2024-04-02 MED ORDER — METHYLPREDNISOLONE ACETATE 40 MG/ML IJ SUSP
80.0000 mg | Freq: Once | INTRAMUSCULAR | Status: AC
  Administered 2024-04-02: 80 mg via INTRAMUSCULAR

## 2024-04-02 MED ORDER — TIZANIDINE HCL 4 MG PO TABS: 4.0000 mg | ORAL_TABLET | Freq: Three times a day (TID) | ORAL | 0 refills | Status: AC | PRN

## 2024-04-02 MED ORDER — TRAMADOL HCL 50 MG PO TABS: 50.0000 mg | ORAL_TABLET | Freq: Three times a day (TID) | ORAL | 0 refills | Status: AC | PRN

## 2024-04-02 NOTE — Progress Notes (Signed)
 "  Acute Office Visit  Subjective:     Patient ID: Alyssa Skinner, female    DOB: May 28, 1975, 48 y.o.   MRN: [id number]  Chief Complaint  Patient presents with   Acute Visit    L hip pain, ongoing since around 12/23. Pain radiates down left leg. Has tried ice, Ibuprofen , and Tizanidine with no relief.    HPI  Discussed the use of AI scribe software for clinical note transcription with the patient, who gave verbal consent to proceed.  History of Present Illness Alyssa Skinner is a 48 year old female with degenerative disc disease who presents with severe back pain radiating down her leg.  Lumbosacral radicular pain - Severe, internal back pain radiating from the back down the leg - Pain is not tender to touch - Pain was worst on March 26, 2024 and remains debilitating - Slight improvement in pain yesterday, but still significant - Back has popped frequently since youth without associated tenderness - Continues to experience internal, radiating pain down the leg  Response to analgesics and muscle relaxants - Ibuprofen  taken early this morning provided partial relief - Ice has helped previously - Tylenol  has not provided relief - Used tizanidine in the past with initial benefit but decreasing effect - Cautious but open to using muscle relaxers again  Imaging and diagnostic history - Back x-ray performed in 2019 - No recent imaging for the current episode     ROS Per HPI      Objective:    BP (!) 160/100 (BP Location: Left Arm, Patient Position: Sitting)   Pulse (!) 103   Temp 97.9 F (36.6 C) (Temporal)   Ht 5' 8.5 (1.74 m)   Wt 155 lb (70.3 kg)   SpO2 99%   BMI 23.22 kg/m    Physical Exam Vitals and nursing note reviewed.  Constitutional:      General: She is not in acute distress.    Appearance: She is normal weight.     Comments: Appears very uncomfortable  HENT:     Head: Normocephalic and atraumatic.     Right Ear: External ear normal.     Left  Ear: External ear normal.     Nose: Nose normal.     Mouth/Throat:     Mouth: Mucous membranes are moist.     Pharynx: Oropharynx is clear.  Eyes:     Extraocular Movements: Extraocular movements intact.     Pupils: Pupils are equal, round, and reactive to light.  Cardiovascular:     Pulses: Normal pulses.  Pulmonary:     Effort: Pulmonary effort is normal.  Musculoskeletal:     Cervical back: Normal range of motion.     Right lower leg: No edema.     Left lower leg: No edema.     Comments: LROM with sitting, standing, position changes.  Unable to sit or lie on exam table.  No obvious swelling, tenderness, discoloration, no obvious deformity noted to the low back or left hip  Lymphadenopathy:     Cervical: No cervical adenopathy.  Neurological:     General: No focal deficit present.     Mental Status: She is alert and oriented to person, place, and time.  Psychiatric:        Mood and Affect: Mood normal.        Thought Content: Thought content normal.     No results found for any visits on 04/02/24.      Assessment & Plan:  Assessment and Plan Assessment & Plan Acute left-sided low back pain with left-sided sciatica, left hip pain Acute exacerbation likely due to nerve compression from degenerative changes. Symptoms improved slightly with ibuprofen  and ice packs. Current symptoms suggestive of sciatica, possibly exacerbated by previous back issues. - Administered Toradol  injection and steroid injection today. - Prescribed oral steroids to start tomorrow. - Prescribed tizanidine 4 mg for muscle relaxation, to be taken at night. - Prescribed tramadol 50 mg tablets, to be taken every six hours as needed for pain. - Ordered lumbar spine x-ray to assess current status of degenerative changes. - Advised against taking ibuprofen  for six hours post-Toradol  injection. - Allowed use of Tylenol  if needed, though she reports it is ineffective.  Degenerative disc disease, lumbar  region with discogenic axial back pain and leg pain, anterolisthesis of lumbar spine Degenerative disc disease in the lumbar region, previously diagnosed in 2019. Current symptoms may be related to this condition, contributing to nerve pain and sciatica. - Ordered lumbar spine x-ray to evaluate current status of degenerative changes. - X-ray reviewed by me, mildly worsened anterolisthesis at L4 and L5     Orders Placed This Encounter  Procedures   DG Lumbar Spine 2-3 Views    Standing Status:   Future    Number of Occurrences:   1    Expiration Date:   10/01/2024    Reason for Exam (SYMPTOM  OR DIAGNOSIS REQUIRED):   low back pain    Is the patient pregnant?:   No    Preferred imaging location?:   [facility] ordered this encounter  Medications   DISCONTD: ketorolac  (TORADOL ) 30 MG/ML injection 30 mg   DISCONTD: methylPREDNISolone  acetate (DEPO-MEDROL ) injection 40 mg   ketorolac  (TORADOL ) 30 MG/ML injection 60 mg   methylPREDNISolone  acetate (DEPO-MEDROL ) injection 80 mg   tiZANidine (ZANAFLEX) 4 MG tablet    Sig: Take 1 tablet (4 mg total) by mouth every 8 (eight) hours as needed for muscle spasms.    Dispense:  30 tablet    Refill:  0   traMADol (ULTRAM) 50 MG tablet    Sig: Take 1 tablet (50 mg total) by mouth every 8 (eight) hours as needed for up to 5 days.    Dispense:  15 tablet    Refill:  0    Return if symptoms worsen or fail to improve.  [name], FNP  "

## 2024-04-02 NOTE — Patient Instructions (Addendum)
 You have received an injection of Toradol  in office today for pain. Do not take any NSAIDs including aspirin, Aleve, ibuprofen , Celebrex, meloxicam  within the next 6 hours after your injection.  You have received a steroid injection in the office today.  I have sent in prednisone for you to take 2 tablets once daily in the morning with breakfast for the next 5 days.   Tizanidine 4mg  to pharmacy for bedtime as a muscle relaxer.  Will send tramadol 50mg  for you to take one tablet every 6 hours as needed for pain.  Follow-up with me for new or worsening symptoms.

## 2024-04-03 ENCOUNTER — Other Ambulatory Visit: Payer: Self-pay | Admitting: Family Medicine

## 2024-04-03 ENCOUNTER — Telehealth: Payer: Self-pay

## 2024-04-03 MED ORDER — PREDNISONE 20 MG PO TABS: 40.0000 mg | ORAL_TABLET | Freq: Every day | ORAL | 0 refills | Status: AC

## 2024-04-03 NOTE — Telephone Encounter (Signed)
 Pt saw stephanie yesterday and per her note I have sent in prednisone for you to take 2 tablets once daily in the morning with breakfast for the next 5 days.  But it looks like it was not sent, can you please send in?

## 2024-04-03 NOTE — Telephone Encounter (Signed)
 Copied from CRM [id number]. Topic: Clinical - Medication Question >> Apr 03, 2024 11:26 AM Tinnie BROCKS wrote: Reason for CRM: Pt calling to check on prednisone rx. Per appt notes, she was to start on oral steroids today. Pharmacy does not have prednisone for her and I do not see that it was sent in.  [facility], [place] - [address] [facility] [place] [address] Phone: [phone] Fax: [phone]  Please call or respond via mychart once complete #[phone]

## 2024-04-03 NOTE — Telephone Encounter (Signed)
 Whoops sorry I saw wrong day, you are DOD. Please help :)

## 2024-04-14 ENCOUNTER — Ambulatory Visit: Payer: Self-pay | Admitting: Family Medicine

## 2024-04-14 NOTE — Progress Notes (Signed)
 Previously addressed.

## 2024-04-19 ENCOUNTER — Encounter: Payer: Self-pay | Admitting: Family Medicine

## 2024-04-19 ENCOUNTER — Other Ambulatory Visit: Payer: Self-pay | Admitting: Family Medicine
